# Patient Record
Sex: Female | Born: 1935 | Race: Black or African American | Hispanic: No | Smoking: Never smoker
Health system: Southern US, Community
[De-identification: ages and names within clinical notes are randomized; demographics above are authoritative.]

## PROBLEM LIST (undated history)

## (undated) DIAGNOSIS — R569 Unspecified convulsions: Secondary | ICD-10-CM

## (undated) DIAGNOSIS — I10 Essential (primary) hypertension: Secondary | ICD-10-CM

## (undated) DIAGNOSIS — J449 Chronic obstructive pulmonary disease, unspecified: Secondary | ICD-10-CM

## (undated) DIAGNOSIS — I5030 Unspecified diastolic (congestive) heart failure: Secondary | ICD-10-CM

## (undated) DIAGNOSIS — C50919 Malignant neoplasm of unspecified site of unspecified female breast: Secondary | ICD-10-CM

## (undated) DIAGNOSIS — E785 Hyperlipidemia, unspecified: Secondary | ICD-10-CM

---

## 2003-09-15 ENCOUNTER — Other Ambulatory Visit: Payer: Self-pay

## 2003-10-14 ENCOUNTER — Other Ambulatory Visit: Payer: Self-pay

## 2004-05-20 ENCOUNTER — Other Ambulatory Visit: Payer: Self-pay

## 2004-05-20 ENCOUNTER — Emergency Department: Payer: Self-pay | Admitting: Emergency Medicine

## 2004-05-23 ENCOUNTER — Emergency Department: Payer: Self-pay | Admitting: Emergency Medicine

## 2004-05-23 ENCOUNTER — Other Ambulatory Visit: Payer: Self-pay

## 2004-12-25 ENCOUNTER — Emergency Department: Payer: Self-pay | Admitting: Emergency Medicine

## 2005-01-19 ENCOUNTER — Emergency Department: Payer: Self-pay | Admitting: Unknown Physician Specialty

## 2005-01-21 ENCOUNTER — Ambulatory Visit: Payer: Self-pay | Admitting: Unknown Physician Specialty

## 2006-05-22 ENCOUNTER — Emergency Department: Payer: Self-pay | Admitting: Emergency Medicine

## 2006-05-23 ENCOUNTER — Emergency Department: Payer: Self-pay | Admitting: Emergency Medicine

## 2007-04-21 ENCOUNTER — Other Ambulatory Visit: Payer: Self-pay

## 2007-04-21 ENCOUNTER — Emergency Department: Payer: Self-pay | Admitting: Emergency Medicine

## 2007-05-20 ENCOUNTER — Ambulatory Visit: Payer: Self-pay | Admitting: Internal Medicine

## 2007-07-12 ENCOUNTER — Ambulatory Visit: Payer: Self-pay | Admitting: Internal Medicine

## 2007-08-10 ENCOUNTER — Encounter: Payer: Self-pay | Admitting: Internal Medicine

## 2007-09-05 ENCOUNTER — Encounter: Payer: Self-pay | Admitting: Internal Medicine

## 2007-09-29 ENCOUNTER — Emergency Department: Payer: Self-pay | Admitting: Emergency Medicine

## 2007-09-29 ENCOUNTER — Other Ambulatory Visit: Payer: Self-pay

## 2007-10-03 ENCOUNTER — Encounter: Payer: Self-pay | Admitting: Internal Medicine

## 2007-11-03 ENCOUNTER — Encounter: Payer: Self-pay | Admitting: Internal Medicine

## 2007-12-03 ENCOUNTER — Encounter: Payer: Self-pay | Admitting: Internal Medicine

## 2008-01-03 ENCOUNTER — Encounter: Payer: Self-pay | Admitting: Internal Medicine

## 2009-01-17 ENCOUNTER — Inpatient Hospital Stay: Payer: Self-pay | Admitting: Internal Medicine

## 2009-04-22 ENCOUNTER — Emergency Department: Payer: Self-pay | Admitting: Emergency Medicine

## 2009-04-29 ENCOUNTER — Emergency Department: Payer: Self-pay | Admitting: Emergency Medicine

## 2009-05-01 ENCOUNTER — Emergency Department: Payer: Self-pay | Admitting: Emergency Medicine

## 2009-05-08 ENCOUNTER — Ambulatory Visit: Payer: Self-pay | Admitting: Internal Medicine

## 2009-09-24 ENCOUNTER — Emergency Department: Payer: Self-pay | Admitting: Emergency Medicine

## 2009-09-29 ENCOUNTER — Inpatient Hospital Stay: Payer: Self-pay | Admitting: *Deleted

## 2009-10-02 ENCOUNTER — Ambulatory Visit: Payer: Self-pay | Admitting: Internal Medicine

## 2009-11-02 ENCOUNTER — Ambulatory Visit: Payer: Self-pay | Admitting: Internal Medicine

## 2009-12-17 ENCOUNTER — Ambulatory Visit: Payer: Self-pay | Admitting: Internal Medicine

## 2010-04-12 ENCOUNTER — Other Ambulatory Visit: Payer: Self-pay | Admitting: Internal Medicine

## 2010-04-13 ENCOUNTER — Emergency Department: Payer: Self-pay | Admitting: Emergency Medicine

## 2010-06-18 ENCOUNTER — Ambulatory Visit: Payer: Self-pay | Admitting: Internal Medicine

## 2010-06-21 ENCOUNTER — Ambulatory Visit: Payer: Self-pay | Admitting: Internal Medicine

## 2010-06-30 ENCOUNTER — Emergency Department: Payer: Self-pay | Admitting: Emergency Medicine

## 2010-07-09 ENCOUNTER — Ambulatory Visit: Payer: Self-pay | Admitting: Internal Medicine

## 2010-10-13 ENCOUNTER — Emergency Department: Payer: Self-pay | Admitting: Emergency Medicine

## 2011-01-27 ENCOUNTER — Ambulatory Visit: Payer: Self-pay | Admitting: Ophthalmology

## 2011-02-11 ENCOUNTER — Ambulatory Visit: Payer: Self-pay | Admitting: Ophthalmology

## 2011-04-23 ENCOUNTER — Inpatient Hospital Stay: Payer: Self-pay | Admitting: Specialist

## 2011-04-26 ENCOUNTER — Emergency Department: Payer: Self-pay | Admitting: Emergency Medicine

## 2011-08-20 ENCOUNTER — Ambulatory Visit: Payer: Self-pay | Admitting: Internal Medicine

## 2011-08-21 ENCOUNTER — Ambulatory Visit: Payer: Self-pay | Admitting: Internal Medicine

## 2011-10-10 ENCOUNTER — Ambulatory Visit: Payer: Self-pay | Admitting: General Surgery

## 2011-10-10 LAB — COMPREHENSIVE METABOLIC PANEL
Alkaline Phosphatase: 112 U/L (ref 50–136)
BUN: 13 mg/dL (ref 7–18)
Bilirubin,Total: 0.3 mg/dL (ref 0.2–1.0)
Calcium, Total: 9.2 mg/dL (ref 8.5–10.1)
Creatinine: 0.96 mg/dL (ref 0.60–1.30)
EGFR (Non-African Amer.): >60
Glucose: 90 mg/dL (ref 65–99)
Osmolality: 281 (ref 275–301)
Potassium: 4 mmol/L (ref 3.5–5.1)
Sodium: 141 mmol/L (ref 136–145)
Total Protein: 7.1 g/dL (ref 6.4–8.2)

## 2011-10-10 LAB — CBC WITH DIFFERENTIAL/PLATELET
Basophil #: 0.1 10*3/uL (ref 0.0–0.1)
Eosinophil #: 0.1 10*3/uL (ref 0.0–0.7)
HGB: 14.1 g/dL (ref 12.0–16.0)
Lymphocyte #: 2.8 10*3/uL (ref 1.0–3.6)
MCH: 30.2 pg (ref 26.0–34.0)
MCHC: 33.5 g/dL (ref 32.0–36.0)
MCV: 90 fL (ref 80–100)
Neutrophil #: 1.9 10*3/uL (ref 1.4–6.5)
Platelet: 224 10*3/uL (ref 150–440)
RBC: 4.66 10*6/uL (ref 3.80–5.20)
RDW: 14.5 % (ref 11.5–14.5)

## 2011-10-11 LAB — CANCER ANTIGEN 27.29: CA 27.29: 22.1 U/mL (ref 0.0–38.6)

## 2011-10-13 ENCOUNTER — Other Ambulatory Visit: Payer: Self-pay | Admitting: General Surgery

## 2011-10-13 DIAGNOSIS — C50912 Malignant neoplasm of unspecified site of left female breast: Secondary | ICD-10-CM

## 2011-10-17 ENCOUNTER — Ambulatory Visit
Admission: RE | Admit: 2011-10-17 | Discharge: 2011-10-17 | Disposition: A | Discharge status: Home / Self Care | Payer: PRIVATE HEALTH INSURANCE | Source: Ambulatory Visit | Attending: General Surgery | Admitting: General Surgery

## 2011-10-17 DIAGNOSIS — C50912 Malignant neoplasm of unspecified site of left female breast: Secondary | ICD-10-CM

## 2011-10-17 MED ORDER — GADOBENATE DIMEGLUMINE 529 MG/ML IV SOLN
12.0000 mL | Freq: Once | INTRAVENOUS | Status: AC | PRN
  Administered 2011-10-17: 12 mL via INTRAVENOUS

## 2011-10-28 ENCOUNTER — Ambulatory Visit: Payer: Self-pay | Admitting: General Surgery

## 2011-11-03 LAB — PATHOLOGY REPORT

## 2011-11-04 ENCOUNTER — Ambulatory Visit: Payer: Self-pay | Admitting: General Surgery

## 2011-11-10 ENCOUNTER — Ambulatory Visit: Payer: Self-pay | Admitting: Oncology

## 2011-11-10 LAB — CBC CANCER CENTER
Basophil #: 0.1 x10 3/mm (ref 0.0–0.1)
Eosinophil #: 0.2 x10 3/mm (ref 0.0–0.7)
HCT: 40.7 % (ref 35.0–47.0)
Lymphocyte #: 2.7 x10 3/mm (ref 1.0–3.6)
Lymphocyte %: 46.9 %
MCH: 30 pg (ref 26.0–34.0)
MCHC: 33.9 g/dL (ref 32.0–36.0)
Neutrophil #: 2.3 x10 3/mm (ref 1.4–6.5)
Neutrophil %: 40.1 %
Platelet: 300 x10 3/mm (ref 150–440)
RBC: 4.6 10*6/uL (ref 3.80–5.20)
WBC: 5.7 x10 3/mm (ref 3.6–11.0)

## 2011-11-10 LAB — COMPREHENSIVE METABOLIC PANEL
Alkaline Phosphatase: 106 U/L (ref 50–136)
BUN: 10 mg/dL (ref 7–18)
Bilirubin,Total: 0.2 mg/dL (ref 0.2–1.0)
Calcium, Total: 9.1 mg/dL (ref 8.5–10.1)
Creatinine: 0.85 mg/dL (ref 0.60–1.30)
EGFR (African American): >60
Glucose: 86 mg/dL (ref 65–99)
Osmolality: 278 (ref 275–301)

## 2011-11-13 ENCOUNTER — Ambulatory Visit: Payer: Self-pay | Admitting: General Surgery

## 2011-11-14 ENCOUNTER — Ambulatory Visit: Payer: Self-pay | Admitting: Oncology

## 2011-11-17 LAB — CBC CANCER CENTER
Basophil %: 2.5 %
Eosinophil #: 0.2 x10 3/mm (ref 0.0–0.7)
Eosinophil %: 4 %
HCT: 39.6 % (ref 35.0–47.0)
MCHC: 33.1 g/dL (ref 32.0–36.0)
MCV: 90 fL (ref 80–100)
Monocyte #: 0.6 x10 3/mm (ref 0.2–0.9)
Monocyte %: 11.7 %
Neutrophil #: 2.3 x10 3/mm (ref 1.4–6.5)
Neutrophil %: 44.3 %
Platelet: 235 x10 3/mm (ref 150–440)
RBC: 4.42 10*6/uL (ref 3.80–5.20)
RDW: 15.2 % — ABNORMAL HIGH (ref 11.5–14.5)
WBC: 5.3 x10 3/mm (ref 3.6–11.0)

## 2011-11-17 LAB — COMPREHENSIVE METABOLIC PANEL
Albumin: 3.2 g/dL — ABNORMAL LOW (ref 3.4–5.0)
Alkaline Phosphatase: 116 U/L (ref 50–136)
Anion Gap: 7 (ref 7–16)
BUN: 11 mg/dL (ref 7–18)
Bilirubin,Total: 0.4 mg/dL (ref 0.2–1.0)
Creatinine: 0.91 mg/dL (ref 0.60–1.30)
EGFR (African American): >60
EGFR (Non-African Amer.): >60
Glucose: 93 mg/dL (ref 65–99)
Osmolality: 278 (ref 275–301)
Potassium: 3.6 mmol/L (ref 3.5–5.1)
SGPT (ALT): 14 U/L
Sodium: 140 mmol/L (ref 136–145)
Total Protein: 6.7 g/dL (ref 6.4–8.2)

## 2011-11-24 LAB — CBC CANCER CENTER
Comment - H1-Com1: NORMAL
HCT: 38.9 % (ref 35.0–47.0)
HGB: 12.7 g/dL (ref 12.0–16.0)
Lymphocytes: 25 %
MCHC: 32.7 g/dL (ref 32.0–36.0)
MCV: 89 fL (ref 80–100)
Metamyelocyte: 2 %
Monocytes: 17 %
RDW: 14.3 % (ref 11.5–14.5)
Segmented Neutrophils: 42 %

## 2011-12-01 LAB — CBC CANCER CENTER
Basophil %: 0.9 %
Eosinophil #: 0 x10 3/mm (ref 0.0–0.7)
Eosinophil %: 0.1 %
HGB: 12.9 g/dL (ref 12.0–16.0)
Lymphocyte #: 2.4 x10 3/mm (ref 1.0–3.6)
Lymphocyte %: 19.2 %
Monocyte %: 7.6 %
Neutrophil #: 9 x10 3/mm — ABNORMAL HIGH (ref 1.4–6.5)
Neutrophil %: 72.2 %
Platelet: 144 x10 3/mm — ABNORMAL LOW (ref 150–440)
WBC: 12.4 x10 3/mm — ABNORMAL HIGH (ref 3.6–11.0)

## 2011-12-03 ENCOUNTER — Ambulatory Visit: Payer: Self-pay | Admitting: Oncology

## 2011-12-08 LAB — COMPREHENSIVE METABOLIC PANEL
Alkaline Phosphatase: 107 U/L (ref 50–136)
Anion Gap: 5 — ABNORMAL LOW (ref 7–16)
Calcium, Total: 9.1 mg/dL (ref 8.5–10.1)
Chloride: 107 mmol/L (ref 98–107)
Co2: 29 mmol/L (ref 21–32)
Creatinine: 0.74 mg/dL (ref 0.60–1.30)
EGFR (Non-African Amer.): >60
Glucose: 113 mg/dL — ABNORMAL HIGH (ref 65–99)
Osmolality: 281 (ref 275–301)
Potassium: 3.5 mmol/L (ref 3.5–5.1)
SGOT(AST): 15 U/L (ref 15–37)
Sodium: 141 mmol/L (ref 136–145)
Total Protein: 6.7 g/dL (ref 6.4–8.2)

## 2011-12-08 LAB — CBC CANCER CENTER
Basophil #: 0 x10 3/mm (ref 0.0–0.1)
Basophil %: 0.4 %
Eosinophil #: 0.1 x10 3/mm (ref 0.0–0.7)
Lymphocyte #: 1.7 x10 3/mm (ref 1.0–3.6)
MCHC: 33 g/dL (ref 32.0–36.0)
MCV: 90 fL (ref 80–100)
Monocyte %: 12.8 %
Neutrophil %: 57.8 %
RBC: 4.22 10*6/uL (ref 3.80–5.20)
RDW: 15.2 % — ABNORMAL HIGH (ref 11.5–14.5)

## 2011-12-15 LAB — CBC CANCER CENTER
Basophil #: 0.1 x10 3/mm (ref 0.0–0.1)
Basophil %: 0.4 %
Eosinophil #: 0.1 x10 3/mm (ref 0.0–0.7)
HCT: 37.3 % (ref 35.0–47.0)
HGB: 12.3 g/dL (ref 12.0–16.0)
Lymphocyte #: 2.3 x10 3/mm (ref 1.0–3.6)
Lymphocyte %: 11.3 %
MCH: 29.4 pg (ref 26.0–34.0)
MCHC: 32.9 g/dL (ref 32.0–36.0)
Monocyte %: 13.9 %
Neutrophil %: 73.8 %
WBC: 20.6 x10 3/mm — ABNORMAL HIGH (ref 3.6–11.0)

## 2011-12-22 LAB — CBC CANCER CENTER
Eosinophil %: 0.3 %
HCT: 37.4 % (ref 35.0–47.0)
HGB: 12.2 g/dL (ref 12.0–16.0)
Lymphocyte #: 2.1 x10 3/mm (ref 1.0–3.6)
MCH: 29.4 pg (ref 26.0–34.0)
MCHC: 32.8 g/dL (ref 32.0–36.0)
MCV: 90 fL (ref 80–100)
Monocyte #: 1.1 x10 3/mm — ABNORMAL HIGH (ref 0.2–0.9)
Monocyte %: 7.7 %
Neutrophil #: 10.7 x10 3/mm — ABNORMAL HIGH (ref 1.4–6.5)
Neutrophil %: 76.3 %
Platelet: 203 x10 3/mm (ref 150–440)
RDW: 15.8 % — ABNORMAL HIGH (ref 11.5–14.5)
WBC: 14 x10 3/mm — ABNORMAL HIGH (ref 3.6–11.0)

## 2011-12-30 LAB — CBC CANCER CENTER
Basophil %: 1.2 %
Eosinophil #: 0.1 x10 3/mm (ref 0.0–0.7)
HCT: 34.3 % — ABNORMAL LOW (ref 35.0–47.0)
HGB: 11.4 g/dL — ABNORMAL LOW (ref 12.0–16.0)
Lymphocyte #: 1.5 x10 3/mm (ref 1.0–3.6)
MCH: 29.8 pg (ref 26.0–34.0)
MCHC: 33.4 g/dL (ref 32.0–36.0)
MCV: 89 fL (ref 80–100)
Monocyte #: 1.1 x10 3/mm — ABNORMAL HIGH (ref 0.2–0.9)
Monocyte %: 18.3 %
Neutrophil #: 3.4 x10 3/mm (ref 1.4–6.5)
Platelet: 222 x10 3/mm (ref 150–440)
RDW: 16.6 % — ABNORMAL HIGH (ref 11.5–14.5)
WBC: 6.1 x10 3/mm (ref 3.6–11.0)

## 2011-12-30 LAB — COMPREHENSIVE METABOLIC PANEL
Alkaline Phosphatase: 100 U/L (ref 50–136)
Anion Gap: 7 (ref 7–16)
BUN: 8 mg/dL (ref 7–18)
Bilirubin,Total: 0.6 mg/dL (ref 0.2–1.0)
Calcium, Total: 8.9 mg/dL (ref 8.5–10.1)
Creatinine: 0.64 mg/dL (ref 0.60–1.30)
EGFR (African American): >60
EGFR (Non-African Amer.): >60
Glucose: 93 mg/dL (ref 65–99)
Osmolality: 281 (ref 275–301)
SGOT(AST): 16 U/L (ref 15–37)
SGPT (ALT): 15 U/L
Total Protein: 6 g/dL — ABNORMAL LOW (ref 6.4–8.2)

## 2011-12-31 ENCOUNTER — Encounter: Payer: Self-pay | Admitting: Oncology

## 2012-01-03 ENCOUNTER — Ambulatory Visit: Payer: Self-pay | Admitting: Oncology

## 2012-01-03 ENCOUNTER — Encounter: Payer: Self-pay | Admitting: Oncology

## 2012-01-06 LAB — CBC CANCER CENTER
Basophil %: 1.8 %
Eosinophil #: 0 x10 3/mm (ref 0.0–0.7)
Eosinophil %: 2.5 %
HCT: 33.5 % — ABNORMAL LOW (ref 35.0–47.0)
MCH: 29.9 pg (ref 26.0–34.0)
Monocyte #: 0.1 x10 3/mm — ABNORMAL LOW (ref 0.2–0.9)
Monocyte %: 4.5 %
Neutrophil %: 34.8 %
Platelet: 188 x10 3/mm (ref 150–440)
WBC: 1.7 x10 3/mm — CL (ref 3.6–11.0)

## 2012-01-13 LAB — CBC CANCER CENTER
Eosinophil #: 0 x10 3/mm (ref 0.0–0.7)
Eosinophil %: 0.7 %
HGB: 11.2 g/dL — ABNORMAL LOW (ref 12.0–16.0)
Lymphocyte #: 1.5 x10 3/mm (ref 1.0–3.6)
Lymphocyte %: 60.1 %
MCH: 29.3 pg (ref 26.0–34.0)
MCHC: 32.8 g/dL (ref 32.0–36.0)
MCV: 89 fL (ref 80–100)
Monocyte #: 0.7 x10 3/mm (ref 0.2–0.9)
Monocyte %: 29.4 %
Neutrophil #: 0.2 x10 3/mm — ABNORMAL LOW (ref 1.4–6.5)

## 2012-01-20 LAB — CBC CANCER CENTER
Basophil #: 0.1 x10 3/mm (ref 0.0–0.1)
Eosinophil #: 0.1 x10 3/mm (ref 0.0–0.7)
Eosinophil %: 1.2 %
HCT: 34.5 % — ABNORMAL LOW (ref 35.0–47.0)
HGB: 11.4 g/dL — ABNORMAL LOW (ref 12.0–16.0)
Lymphocyte #: 1.3 x10 3/mm (ref 1.0–3.6)
MCHC: 33.1 g/dL (ref 32.0–36.0)
MCV: 89 fL (ref 80–100)
Monocyte %: 20.8 %
Neutrophil #: 2.3 x10 3/mm (ref 1.4–6.5)
Neutrophil %: 48.9 %
Platelet: 234 x10 3/mm (ref 150–440)
WBC: 4.8 x10 3/mm (ref 3.6–11.0)

## 2012-01-20 LAB — COMPREHENSIVE METABOLIC PANEL
Albumin: 2.8 g/dL — ABNORMAL LOW (ref 3.4–5.0)
Anion Gap: 10 (ref 7–16)
BUN: 8 mg/dL (ref 7–18)
Creatinine: 0.8 mg/dL (ref 0.60–1.30)
EGFR (African American): >60
EGFR (Non-African Amer.): >60
Osmolality: 279 (ref 275–301)
Potassium: 2.9 mmol/L — ABNORMAL LOW (ref 3.5–5.1)
SGOT(AST): 14 U/L — ABNORMAL LOW (ref 15–37)
SGPT (ALT): 13 U/L
Sodium: 140 mmol/L (ref 136–145)
Total Protein: 6.2 g/dL — ABNORMAL LOW (ref 6.4–8.2)

## 2012-01-25 ENCOUNTER — Emergency Department: Payer: Self-pay | Admitting: *Deleted

## 2012-01-25 LAB — CBC WITH DIFFERENTIAL/PLATELET
Basophil #: 0 10*3/uL (ref 0.0–0.1)
Basophil %: 1 %
Eosinophil #: 0.1 10*3/uL (ref 0.0–0.7)
Eosinophil %: 1.9 %
HCT: 35.1 % (ref 35.0–47.0)
Lymphocyte %: 22.7 %
MCH: 29.7 pg (ref 26.0–34.0)
MCHC: 33 g/dL (ref 32.0–36.0)
Monocyte #: 0.1 x10 3/mm — ABNORMAL LOW (ref 0.2–0.9)
Monocyte %: 3.5 %
Neutrophil #: 1.9 10*3/uL (ref 1.4–6.5)
RBC: 3.89 10*6/uL (ref 3.80–5.20)
RDW: 15.9 % — ABNORMAL HIGH (ref 11.5–14.5)
WBC: 2.7 10*3/uL — ABNORMAL LOW (ref 3.6–11.0)

## 2012-01-25 LAB — THEOPHYLLINE LEVEL: Theophylline: <2 ug/mL — ABNORMAL LOW (ref 10.0–20.0)

## 2012-01-25 LAB — COMPREHENSIVE METABOLIC PANEL
BUN: 12 mg/dL (ref 7–18)
Bilirubin,Total: 0.3 mg/dL (ref 0.2–1.0)
Calcium, Total: 8.9 mg/dL (ref 8.5–10.1)
Chloride: 105 mmol/L (ref 98–107)
EGFR (African American): >60
Osmolality: 275 (ref 275–301)
Potassium: 4.5 mmol/L (ref 3.5–5.1)
SGPT (ALT): 10 U/L — ABNORMAL LOW
Sodium: 138 mmol/L (ref 136–145)

## 2012-01-27 LAB — CBC CANCER CENTER
Basophil #: 0 x10 3/mm (ref 0.0–0.1)
Eosinophil #: 0 x10 3/mm (ref 0.0–0.7)
Eosinophil %: 1.4 %
HCT: 34.4 % — ABNORMAL LOW (ref 35.0–47.0)
HGB: 11.2 g/dL — ABNORMAL LOW (ref 12.0–16.0)
Lymphocyte #: 0.7 x10 3/mm — ABNORMAL LOW (ref 1.0–3.6)
Lymphocyte %: 42.7 %
MCH: 29.3 pg (ref 26.0–34.0)
MCHC: 32.6 g/dL (ref 32.0–36.0)
MCV: 90 fL (ref 80–100)
Monocyte %: 6.6 %
Neutrophil #: 0.8 x10 3/mm — ABNORMAL LOW (ref 1.4–6.5)
RDW: 16.1 % — ABNORMAL HIGH (ref 11.5–14.5)
WBC: 1.7 x10 3/mm — CL (ref 3.6–11.0)

## 2012-02-01 ENCOUNTER — Inpatient Hospital Stay: Payer: Self-pay | Admitting: Internal Medicine

## 2012-02-01 LAB — COMPREHENSIVE METABOLIC PANEL
Albumin: 3.5 g/dL (ref 3.4–5.0)
Alkaline Phosphatase: 198 U/L — ABNORMAL HIGH (ref 50–136)
Anion Gap: 10 (ref 7–16)
Bilirubin,Total: 0.4 mg/dL (ref 0.2–1.0)
Calcium, Total: 9.4 mg/dL (ref 8.5–10.1)
Co2: 27 mmol/L (ref 21–32)
Creatinine: 1.17 mg/dL (ref 0.60–1.30)
EGFR (African American): 53 — ABNORMAL LOW
EGFR (Non-African Amer.): 46 — ABNORMAL LOW
Glucose: 86 mg/dL (ref 65–99)
Osmolality: 281 (ref 275–301)
SGOT(AST): 32 U/L (ref 15–37)
SGPT (ALT): 16 U/L
Sodium: 140 mmol/L (ref 136–145)
Total Protein: 7.1 g/dL (ref 6.4–8.2)

## 2012-02-01 LAB — CK TOTAL AND CKMB (NOT AT ARMC)
CK, Total: 40 U/L (ref 21–215)
CK, Total: 27 U/L (ref 21–215)
CK, Total: 34 U/L (ref 21–215)
CK-MB: 0.8 ng/mL (ref 0.5–3.6)
CK-MB: <0.5 ng/mL — ABNORMAL LOW (ref 0.5–3.6)
CK-MB: <0.5 ng/mL — ABNORMAL LOW (ref 0.5–3.6)

## 2012-02-01 LAB — CBC WITH DIFFERENTIAL/PLATELET
HCT: 37.1 % (ref 35.0–47.0)
HGB: 12 g/dL (ref 12.0–16.0)
Lymphocytes: 10 %
MCHC: 32.4 g/dL (ref 32.0–36.0)
MCV: 90 fL (ref 80–100)
Metamyelocyte: 12 %
Monocytes: 6 %
NRBC/100 WBC: 4 /
Platelet: 205 10*3/uL (ref 150–440)
RBC: 4.14 10*6/uL (ref 3.80–5.20)
RDW: 16.6 % — ABNORMAL HIGH (ref 11.5–14.5)

## 2012-02-01 LAB — PROTIME-INR: Prothrombin Time: 13 secs (ref 11.5–14.7)

## 2012-02-01 LAB — THEOPHYLLINE LEVEL: Theophylline: <2 ug/mL — ABNORMAL LOW (ref 10.0–20.0)

## 2012-02-01 LAB — TROPONIN I: Troponin-I: <0.02 ng/mL

## 2012-02-02 ENCOUNTER — Encounter: Payer: Self-pay | Admitting: Oncology

## 2012-02-02 ENCOUNTER — Ambulatory Visit: Payer: Self-pay | Admitting: Oncology

## 2012-02-03 LAB — CBC CANCER CENTER
Bands: 16 %
HCT: 36.1 % (ref 35.0–47.0)
HGB: 11.2 g/dL — ABNORMAL LOW (ref 12.0–16.0)
Lymphocytes: 2 %
MCH: 27.8 pg (ref 26.0–34.0)
Metamyelocyte: 2 %
Monocytes: 3 %
Myelocyte: 1 %
RBC: 4.04 10*6/uL (ref 3.80–5.20)
Segmented Neutrophils: 76 %
WBC: 85.1 x10 3/mm — ABNORMAL HIGH (ref 3.6–11.0)

## 2012-02-10 LAB — CBC CANCER CENTER
Eosinophil #: 0 x10 3/mm (ref 0.0–0.7)
HCT: 34.7 % — ABNORMAL LOW (ref 35.0–47.0)
Lymphocyte #: 1 x10 3/mm (ref 1.0–3.6)
MCH: 29.8 pg (ref 26.0–34.0)
MCHC: 33 g/dL (ref 32.0–36.0)
Monocyte #: 0.9 x10 3/mm (ref 0.2–0.9)
Neutrophil %: 77.7 %
Platelet: 206 x10 3/mm (ref 150–440)
WBC: 9 x10 3/mm (ref 3.6–11.0)

## 2012-02-10 LAB — COMPREHENSIVE METABOLIC PANEL
Anion Gap: 8 (ref 7–16)
BUN: 10 mg/dL (ref 7–18)
Calcium, Total: 8.4 mg/dL — ABNORMAL LOW (ref 8.5–10.1)
Chloride: 104 mmol/L (ref 98–107)
Co2: 27 mmol/L (ref 21–32)
EGFR (African American): >60
EGFR (Non-African Amer.): >60
Potassium: 3.8 mmol/L (ref 3.5–5.1)
SGOT(AST): 12 U/L — ABNORMAL LOW (ref 15–37)
SGPT (ALT): 19 U/L
Total Protein: 6 g/dL — ABNORMAL LOW (ref 6.4–8.2)

## 2012-02-17 ENCOUNTER — Ambulatory Visit: Payer: Self-pay

## 2012-02-17 LAB — CBC CANCER CENTER
Basophil #: 0 x10 3/mm (ref 0.0–0.1)
Basophil %: 3.7 %
Eosinophil #: 0 x10 3/mm (ref 0.0–0.7)
HCT: 32.8 % — ABNORMAL LOW (ref 35.0–47.0)
Lymphocyte #: 0.6 x10 3/mm — ABNORMAL LOW (ref 1.0–3.6)
MCH: 29.5 pg (ref 26.0–34.0)
MCHC: 32.7 g/dL (ref 32.0–36.0)
MCV: 90 fL (ref 80–100)
Monocyte #: 0.1 x10 3/mm — ABNORMAL LOW (ref 0.2–0.9)
Monocyte %: 7.9 %
Neutrophil #: 0.3 x10 3/mm — ABNORMAL LOW (ref 1.4–6.5)
Platelet: 178 x10 3/mm (ref 150–440)
RDW: 17.3 % — ABNORMAL HIGH (ref 11.5–14.5)

## 2012-02-24 LAB — CBC CANCER CENTER
Eosinophil #: 0 x10 3/mm (ref 0.0–0.7)
Eosinophil %: 0.3 %
HGB: 11.2 g/dL — ABNORMAL LOW (ref 12.0–16.0)
Lymphocyte #: 0.9 x10 3/mm — ABNORMAL LOW (ref 1.0–3.6)
Lymphocyte %: 41.4 %
MCHC: 32.3 g/dL (ref 32.0–36.0)
MCV: 90 fL (ref 80–100)
Monocyte #: 0.7 x10 3/mm (ref 0.2–0.9)
Neutrophil #: 0.5 x10 3/mm — ABNORMAL LOW (ref 1.4–6.5)
Neutrophil %: 24.1 %
Platelet: 224 x10 3/mm (ref 150–440)
RBC: 3.85 10*6/uL (ref 3.80–5.20)
RDW: 17.8 % — ABNORMAL HIGH (ref 11.5–14.5)

## 2012-03-02 LAB — COMPREHENSIVE METABOLIC PANEL
Albumin: 2.9 g/dL — ABNORMAL LOW (ref 3.4–5.0)
Anion Gap: 10 (ref 7–16)
BUN: 11 mg/dL (ref 7–18)
Calcium, Total: 8.9 mg/dL (ref 8.5–10.1)
Chloride: 105 mmol/L (ref 98–107)
Creatinine: 0.88 mg/dL (ref 0.60–1.30)
EGFR (African American): >60
Glucose: 109 mg/dL — ABNORMAL HIGH (ref 65–99)
Osmolality: 281 (ref 275–301)
Potassium: 3.8 mmol/L (ref 3.5–5.1)
SGOT(AST): 11 U/L — ABNORMAL LOW (ref 15–37)
Sodium: 141 mmol/L (ref 136–145)
Total Protein: 6.1 g/dL — ABNORMAL LOW (ref 6.4–8.2)

## 2012-03-02 LAB — CBC CANCER CENTER
Basophil %: 0.5 %
Eosinophil %: 0 %
MCH: 30.5 pg (ref 26.0–34.0)
MCHC: 33.8 g/dL (ref 32.0–36.0)
Monocyte #: 0.4 x10 3/mm (ref 0.2–0.9)
Monocyte %: 8.1 %
Neutrophil #: 4 x10 3/mm (ref 1.4–6.5)
Neutrophil %: 73.8 %
WBC: 5.4 x10 3/mm (ref 3.6–11.0)

## 2012-03-04 ENCOUNTER — Ambulatory Visit: Payer: Self-pay | Admitting: Oncology

## 2012-03-04 ENCOUNTER — Encounter: Payer: Self-pay | Admitting: Oncology

## 2012-03-06 ENCOUNTER — Emergency Department: Payer: Self-pay | Admitting: Emergency Medicine

## 2012-03-06 LAB — COMPREHENSIVE METABOLIC PANEL
Alkaline Phosphatase: 126 U/L (ref 50–136)
Anion Gap: 13 (ref 7–16)
Bilirubin,Total: 0.5 mg/dL (ref 0.2–1.0)
Chloride: 107 mmol/L (ref 98–107)
Co2: 23 mmol/L (ref 21–32)
Creatinine: 0.68 mg/dL (ref 0.60–1.30)
EGFR (African American): >60
EGFR (Non-African Amer.): >60
Osmolality: 284 (ref 275–301)
Potassium: 3 mmol/L — ABNORMAL LOW (ref 3.5–5.1)
SGOT(AST): 17 U/L (ref 15–37)
SGPT (ALT): 12 U/L (ref 12–78)

## 2012-03-06 LAB — CBC WITH DIFFERENTIAL/PLATELET
Basophil #: 0 10*3/uL (ref 0.0–0.1)
Basophil %: 0.9 %
Eosinophil #: 0.1 10*3/uL (ref 0.0–0.7)
Eosinophil %: 0.2 %
HGB: 11.4 g/dL — ABNORMAL LOW (ref 12.0–16.0)
Lymphocyte #: 1.2 10*3/uL (ref 1.0–3.6)
MCH: 30 pg (ref 26.0–34.0)
MCV: 90 fL (ref 80–100)
Monocyte #: 0.3 x10 3/mm (ref 0.2–0.9)
Neutrophil #: 37.9 10*3/uL — ABNORMAL HIGH (ref 1.4–6.5)
RBC: 3.81 10*6/uL (ref 3.80–5.20)
RDW: 18 % — ABNORMAL HIGH (ref 11.5–14.5)

## 2012-03-06 LAB — MAGNESIUM: Magnesium: 1.9 mg/dL

## 2012-03-11 LAB — CULTURE, BLOOD (SINGLE)

## 2012-04-04 ENCOUNTER — Ambulatory Visit: Payer: Self-pay | Admitting: Oncology

## 2012-04-04 ENCOUNTER — Encounter: Payer: Self-pay | Admitting: Oncology

## 2012-04-06 ENCOUNTER — Ambulatory Visit: Payer: Self-pay | Admitting: Oncology

## 2012-04-06 LAB — CBC CANCER CENTER
Basophil #: 0.1 x10 3/mm (ref 0.0–0.1)
Basophil %: 1 %
Eosinophil #: 0.2 x10 3/mm (ref 0.0–0.7)
Eosinophil %: 4.7 %
HCT: 37 % (ref 35.0–47.0)
HGB: 12 g/dL (ref 12.0–16.0)
Lymphocyte %: 41 %
MCHC: 32.5 g/dL (ref 32.0–36.0)
Monocyte %: 11.7 %
Neutrophil #: 2.1 x10 3/mm (ref 1.4–6.5)
Platelet: 247 x10 3/mm (ref 150–440)
RBC: 3.94 10*6/uL (ref 3.80–5.20)
WBC: 5.1 x10 3/mm (ref 3.6–11.0)

## 2012-04-06 LAB — COMPREHENSIVE METABOLIC PANEL
Alkaline Phosphatase: 110 U/L (ref 50–136)
BUN: 9 mg/dL (ref 7–18)
Bilirubin,Total: 0.4 mg/dL (ref 0.2–1.0)
Calcium, Total: 8.7 mg/dL (ref 8.5–10.1)
Chloride: 110 mmol/L — ABNORMAL HIGH (ref 98–107)
Creatinine: 0.83 mg/dL (ref 0.60–1.30)
EGFR (African American): >60
EGFR (Non-African Amer.): >60
Glucose: 91 mg/dL (ref 65–99)
SGOT(AST): 20 U/L (ref 15–37)
SGPT (ALT): 14 U/L (ref 12–78)
Sodium: 141 mmol/L (ref 136–145)

## 2012-04-29 LAB — CBC CANCER CENTER
Basophil %: 1.4 %
Eosinophil %: 4.6 %
HCT: 39.5 % (ref 35.0–47.0)
MCH: 30.5 pg (ref 26.0–34.0)
MCV: 94 fL (ref 80–100)
Monocyte #: 0.4 x10 3/mm (ref 0.2–0.9)
Monocyte %: 14 %
Neutrophil #: 1.6 x10 3/mm (ref 1.4–6.5)
Platelet: 180 x10 3/mm (ref 150–440)
RBC: 4.19 10*6/uL (ref 3.80–5.20)
RDW: 16.7 % — ABNORMAL HIGH (ref 11.5–14.5)
WBC: 3.1 x10 3/mm — ABNORMAL LOW (ref 3.6–11.0)

## 2012-05-04 ENCOUNTER — Ambulatory Visit: Payer: Self-pay | Admitting: Oncology

## 2012-05-05 LAB — CBC CANCER CENTER
Basophil #: 0.1 x10 3/mm (ref 0.0–0.1)
Basophil %: 1.4 %
HCT: 39.5 % (ref 35.0–47.0)
HGB: 12.9 g/dL (ref 12.0–16.0)
Lymphocyte #: 1.1 x10 3/mm (ref 1.0–3.6)
Lymphocyte %: 29.8 %
MCH: 30.5 pg (ref 26.0–34.0)
MCV: 94 fL (ref 80–100)
Monocyte #: 0.5 x10 3/mm (ref 0.2–0.9)
Monocyte %: 13.1 %
Neutrophil #: 1.9 x10 3/mm (ref 1.4–6.5)
Neutrophil %: 51.4 %
RBC: 4.22 10*6/uL (ref 3.80–5.20)
RDW: 16.6 % — ABNORMAL HIGH (ref 11.5–14.5)
WBC: 3.7 x10 3/mm (ref 3.6–11.0)

## 2012-05-12 LAB — CBC CANCER CENTER
Basophil #: 0 x10 3/mm (ref 0.0–0.1)
Basophil %: 1.7 %
Eosinophil %: 4.6 %
HCT: 40.4 % (ref 35.0–47.0)
Lymphocyte #: 0.8 x10 3/mm — ABNORMAL LOW (ref 1.0–3.6)
MCH: 30.7 pg (ref 26.0–34.0)
MCHC: 32.9 g/dL (ref 32.0–36.0)
MCV: 93 fL (ref 80–100)
Monocyte #: 0.4 x10 3/mm (ref 0.2–0.9)
Monocyte %: 14.2 %
Neutrophil #: 1.5 x10 3/mm (ref 1.4–6.5)
Platelet: 193 x10 3/mm (ref 150–440)
RBC: 4.33 10*6/uL (ref 3.80–5.20)
WBC: 2.8 x10 3/mm — ABNORMAL LOW (ref 3.6–11.0)

## 2012-05-19 LAB — CBC CANCER CENTER
Basophil #: 0 x10 3/mm (ref 0.0–0.1)
Eosinophil #: 0.2 x10 3/mm (ref 0.0–0.7)
HCT: 40.3 % (ref 35.0–47.0)
HGB: 13.2 g/dL (ref 12.0–16.0)
Lymphocyte #: 0.8 x10 3/mm — ABNORMAL LOW (ref 1.0–3.6)
Lymphocyte %: 24.2 %
MCH: 30.7 pg (ref 26.0–34.0)
MCHC: 32.6 g/dL (ref 32.0–36.0)
MCV: 94 fL (ref 80–100)
Monocyte %: 13.5 %
Neutrophil #: 1.8 x10 3/mm (ref 1.4–6.5)
Platelet: 176 x10 3/mm (ref 150–440)
RDW: 15.9 % — ABNORMAL HIGH (ref 11.5–14.5)
WBC: 3.1 x10 3/mm — ABNORMAL LOW (ref 3.6–11.0)

## 2012-05-26 LAB — CBC CANCER CENTER
Basophil #: 0 x10 3/mm (ref 0.0–0.1)
Eosinophil #: 0.1 x10 3/mm (ref 0.0–0.7)
HCT: 41.8 % (ref 35.0–47.0)
MCH: 31 pg (ref 26.0–34.0)
MCHC: 32.8 g/dL (ref 32.0–36.0)
Monocyte #: 0.4 x10 3/mm (ref 0.2–0.9)
Neutrophil %: 53.5 %
Platelet: 192 x10 3/mm (ref 150–440)
RBC: 4.42 10*6/uL (ref 3.80–5.20)
RDW: 14.9 % — ABNORMAL HIGH (ref 11.5–14.5)
WBC: 2.5 x10 3/mm — ABNORMAL LOW (ref 3.6–11.0)

## 2012-06-02 LAB — CBC CANCER CENTER
Basophil #: 0 x10 3/mm (ref 0.0–0.1)
Basophil %: 0.2 %
Eosinophil #: 0.2 x10 3/mm (ref 0.0–0.7)
Eosinophil %: 6.6 %
HGB: 13.7 g/dL (ref 12.0–16.0)
Lymphocyte %: 25.5 %
MCH: 30.7 pg (ref 26.0–34.0)
MCHC: 32.4 g/dL (ref 32.0–36.0)
MCV: 95 fL (ref 80–100)
Monocyte #: 0.5 x10 3/mm (ref 0.2–0.9)
Neutrophil #: 1.2 x10 3/mm — ABNORMAL LOW (ref 1.4–6.5)
Platelet: 192 x10 3/mm (ref 150–440)
RDW: 15.1 % — ABNORMAL HIGH (ref 11.5–14.5)
WBC: 2.5 x10 3/mm — ABNORMAL LOW (ref 3.6–11.0)

## 2012-06-04 ENCOUNTER — Ambulatory Visit: Payer: Self-pay | Admitting: Oncology

## 2012-07-04 ENCOUNTER — Ambulatory Visit: Payer: Self-pay | Admitting: Oncology

## 2012-09-04 ENCOUNTER — Ambulatory Visit: Payer: Self-pay | Admitting: Internal Medicine

## 2012-09-21 ENCOUNTER — Inpatient Hospital Stay: Payer: Self-pay | Admitting: Internal Medicine

## 2012-09-21 LAB — COMPREHENSIVE METABOLIC PANEL
Albumin: 3 g/dL — ABNORMAL LOW (ref 3.4–5.0)
Bilirubin,Total: 0.7 mg/dL (ref 0.2–1.0)
Calcium, Total: 8.9 mg/dL (ref 8.5–10.1)
Co2: 25 mmol/L (ref 21–32)
EGFR (Non-African Amer.): >60
Glucose: 112 mg/dL — ABNORMAL HIGH (ref 65–99)
Osmolality: 279 (ref 275–301)
SGOT(AST): 26 U/L (ref 15–37)
SGPT (ALT): 14 U/L (ref 12–78)
Sodium: 140 mmol/L (ref 136–145)

## 2012-09-21 LAB — CBC
MCHC: 34.5 g/dL (ref 32.0–36.0)
MCV: 91 fL (ref 80–100)
RDW: 14.4 % (ref 11.5–14.5)
WBC: 9.6 10*3/uL (ref 3.6–11.0)

## 2012-09-21 LAB — CK TOTAL AND CKMB (NOT AT ARMC): CK-MB: <0.5 ng/mL — ABNORMAL LOW (ref 0.5–3.6)

## 2012-09-22 LAB — CBC WITH DIFFERENTIAL/PLATELET
Basophil #: 0 10*3/uL (ref 0.0–0.1)
Basophil %: 0.2 %
Eosinophil #: 0 10*3/uL (ref 0.0–0.7)
HGB: 12.4 g/dL (ref 12.0–16.0)
Lymphocyte #: 0.4 10*3/uL — ABNORMAL LOW (ref 1.0–3.6)
Lymphocyte %: 3.3 %
MCH: 31.6 pg (ref 26.0–34.0)
MCV: 94 fL (ref 80–100)
Neutrophil %: 93.7 %
RBC: 3.94 10*6/uL (ref 3.80–5.20)

## 2012-09-22 LAB — BASIC METABOLIC PANEL
Anion Gap: 8 (ref 7–16)
BUN: 12 mg/dL (ref 7–18)
Calcium, Total: 9.3 mg/dL (ref 8.5–10.1)
Chloride: 109 mmol/L — ABNORMAL HIGH (ref 98–107)
Co2: 25 mmol/L (ref 21–32)
EGFR (African American): >60
Glucose: 138 mg/dL — ABNORMAL HIGH (ref 65–99)
Sodium: 142 mmol/L (ref 136–145)

## 2012-09-27 LAB — CULTURE, BLOOD (SINGLE)

## 2012-09-29 LAB — BASIC METABOLIC PANEL
Anion Gap: 5 — ABNORMAL LOW (ref 7–16)
Calcium, Total: 8.7 mg/dL (ref 8.5–10.1)
Co2: 30 mmol/L (ref 21–32)
Creatinine: 0.83 mg/dL (ref 0.60–1.30)
EGFR (African American): >60
Glucose: 92 mg/dL (ref 65–99)
Potassium: 5.1 mmol/L (ref 3.5–5.1)

## 2012-09-29 LAB — TROPONIN I: Troponin-I: <0.02 ng/mL

## 2012-09-29 LAB — CBC
HCT: 43.8 % (ref 35.0–47.0)
MCH: 29.4 pg (ref 26.0–34.0)
MCHC: 32 g/dL (ref 32.0–36.0)
MCV: 92 fL (ref 80–100)
Platelet: 289 10*3/uL (ref 150–440)
WBC: 8.7 10*3/uL (ref 3.6–11.0)

## 2012-09-30 ENCOUNTER — Inpatient Hospital Stay: Payer: Self-pay | Admitting: Internal Medicine

## 2012-10-01 LAB — BASIC METABOLIC PANEL
Anion Gap: 7 (ref 7–16)
Calcium, Total: 8.6 mg/dL (ref 8.5–10.1)
Co2: 29 mmol/L (ref 21–32)
EGFR (African American): 54 — ABNORMAL LOW
EGFR (Non-African Amer.): 47 — ABNORMAL LOW
Glucose: 131 mg/dL — ABNORMAL HIGH (ref 65–99)
Osmolality: 287 (ref 275–301)
Potassium: 4.6 mmol/L (ref 3.5–5.1)

## 2012-10-01 LAB — CBC WITH DIFFERENTIAL/PLATELET
Basophil #: 0 10*3/uL (ref 0.0–0.1)
Basophil %: 0.2 %
Eosinophil %: 0 %
HCT: 36.5 % (ref 35.0–47.0)
HGB: 12.2 g/dL (ref 12.0–16.0)
MCV: 92 fL (ref 80–100)
Monocyte #: 0.4 x10 3/mm (ref 0.2–0.9)
Monocyte %: 2.5 %
Neutrophil #: 15.6 10*3/uL — ABNORMAL HIGH (ref 1.4–6.5)
Neutrophil %: 94.7 %
Platelet: 224 10*3/uL (ref 150–440)
RBC: 3.96 10*6/uL (ref 3.80–5.20)
RDW: 15.1 % — ABNORMAL HIGH (ref 11.5–14.5)
WBC: 16.5 10*3/uL — ABNORMAL HIGH (ref 3.6–11.0)

## 2012-10-02 ENCOUNTER — Ambulatory Visit: Payer: Self-pay | Admitting: Internal Medicine

## 2012-10-03 LAB — CBC WITH DIFFERENTIAL/PLATELET
Basophil #: 0 10*3/uL (ref 0.0–0.1)
Eosinophil #: 0 10*3/uL (ref 0.0–0.7)
HCT: 39.3 % (ref 35.0–47.0)
HGB: 12.8 g/dL (ref 12.0–16.0)
Lymphocyte #: 0.3 10*3/uL — ABNORMAL LOW (ref 1.0–3.6)
MCH: 30.6 pg (ref 26.0–34.0)
Monocyte %: 1.6 %
Neutrophil #: 15.1 10*3/uL — ABNORMAL HIGH (ref 1.4–6.5)
Neutrophil %: 96.6 %
Platelet: 195 10*3/uL (ref 150–440)
RBC: 4.19 10*6/uL (ref 3.80–5.20)
RDW: 15.4 % — ABNORMAL HIGH (ref 11.5–14.5)
WBC: 15.6 10*3/uL — ABNORMAL HIGH (ref 3.6–11.0)

## 2012-10-03 LAB — BASIC METABOLIC PANEL
Anion Gap: 2 — ABNORMAL LOW (ref 7–16)
Calcium, Total: 8.8 mg/dL (ref 8.5–10.1)
Chloride: 106 mmol/L (ref 98–107)
Co2: 34 mmol/L — ABNORMAL HIGH (ref 21–32)
Creatinine: 1.02 mg/dL (ref 0.60–1.30)

## 2012-10-04 LAB — THEOPHYLLINE LEVEL: Theophylline: 4.9 ug/mL — ABNORMAL LOW (ref 10.0–20.0)

## 2012-10-05 LAB — CBC WITH DIFFERENTIAL/PLATELET
Eosinophil %: 0 %
HCT: 35.7 % (ref 35.0–47.0)
Lymphocyte %: 1 %
MCH: 30.8 pg (ref 26.0–34.0)
MCHC: 33.2 g/dL (ref 32.0–36.0)
MCV: 93 fL (ref 80–100)
Monocyte #: 0.3 x10 3/mm (ref 0.2–0.9)
Monocyte %: 3.2 %
Neutrophil #: 9.8 10*3/uL — ABNORMAL HIGH (ref 1.4–6.5)
Platelet: 200 10*3/uL (ref 150–440)
RBC: 3.86 10*6/uL (ref 3.80–5.20)
WBC: 10.2 10*3/uL (ref 3.6–11.0)

## 2012-10-05 LAB — MAGNESIUM: Magnesium: 2.4 mg/dL

## 2012-10-05 LAB — BASIC METABOLIC PANEL
Anion Gap: 7 (ref 7–16)
Calcium, Total: 8.1 mg/dL — ABNORMAL LOW (ref 8.5–10.1)
EGFR (African American): 52 — ABNORMAL LOW
EGFR (Non-African Amer.): 45 — ABNORMAL LOW
Osmolality: 291 (ref 275–301)

## 2012-10-06 LAB — CBC WITH DIFFERENTIAL/PLATELET
Basophil %: 0.1 %
Eosinophil #: 0 10*3/uL (ref 0.0–0.7)
Eosinophil %: 0 %
HCT: 34 % — ABNORMAL LOW (ref 35.0–47.0)
HGB: 11.4 g/dL — ABNORMAL LOW (ref 12.0–16.0)
Lymphocyte #: 0.2 10*3/uL — ABNORMAL LOW (ref 1.0–3.6)
Lymphocyte %: 1.9 %
MCH: 30.9 pg (ref 26.0–34.0)
MCV: 92 fL (ref 80–100)
Monocyte #: 0.5 x10 3/mm (ref 0.2–0.9)
Monocyte %: 5.5 %
Neutrophil #: 7.8 10*3/uL — ABNORMAL HIGH (ref 1.4–6.5)
RDW: 15 % — ABNORMAL HIGH (ref 11.5–14.5)
WBC: 8.4 10*3/uL (ref 3.6–11.0)

## 2012-10-06 LAB — BASIC METABOLIC PANEL
BUN: 46 mg/dL — ABNORMAL HIGH (ref 7–18)
Chloride: 104 mmol/L (ref 98–107)
EGFR (African American): >60
EGFR (Non-African Amer.): 56 — ABNORMAL LOW
Glucose: 115 mg/dL — ABNORMAL HIGH (ref 65–99)
Osmolality: 287 (ref 275–301)
Potassium: 4 mmol/L (ref 3.5–5.1)
Sodium: 137 mmol/L (ref 136–145)

## 2012-10-06 LAB — TRIGLYCERIDES: Triglycerides: 156 mg/dL (ref 0–200)

## 2012-10-07 LAB — CBC WITH DIFFERENTIAL/PLATELET
Basophil %: 0.1 %
Eosinophil #: 0 10*3/uL (ref 0.0–0.7)
HCT: 32.9 % — ABNORMAL LOW (ref 35.0–47.0)
HGB: 11 g/dL — ABNORMAL LOW (ref 12.0–16.0)
Lymphocyte %: 1.9 %
MCH: 30.6 pg (ref 26.0–34.0)
MCHC: 33.3 g/dL (ref 32.0–36.0)
MCV: 92 fL (ref 80–100)
Monocyte %: 3.6 %
Neutrophil #: 6.8 10*3/uL — ABNORMAL HIGH (ref 1.4–6.5)
Neutrophil %: 94.4 %
Platelet: 151 10*3/uL (ref 150–440)
RDW: 14.8 % — ABNORMAL HIGH (ref 11.5–14.5)
WBC: 7.2 10*3/uL (ref 3.6–11.0)

## 2012-10-07 LAB — THEOPHYLLINE LEVEL: Theophylline: 8.6 ug/mL — ABNORMAL LOW (ref 10.0–20.0)

## 2012-10-07 LAB — BASIC METABOLIC PANEL
Anion Gap: 3 — ABNORMAL LOW (ref 7–16)
BUN: 47 mg/dL — ABNORMAL HIGH (ref 7–18)
Calcium, Total: 8 mg/dL — ABNORMAL LOW (ref 8.5–10.1)
Co2: 31 mmol/L (ref 21–32)
Glucose: 129 mg/dL — ABNORMAL HIGH (ref 65–99)
Osmolality: 290 (ref 275–301)

## 2012-10-10 LAB — BASIC METABOLIC PANEL
Calcium, Total: 8.3 mg/dL — ABNORMAL LOW (ref 8.5–10.1)
Chloride: 105 mmol/L (ref 98–107)
Co2: 30 mmol/L (ref 21–32)
Glucose: 85 mg/dL (ref 65–99)
Osmolality: 287 (ref 275–301)

## 2012-10-14 ENCOUNTER — Inpatient Hospital Stay: Payer: Self-pay | Admitting: Internal Medicine

## 2012-10-14 LAB — CBC WITH DIFFERENTIAL/PLATELET
Bands: 39 %
HCT: 40.7 % (ref 35.0–47.0)
HGB: 13.9 g/dL (ref 12.0–16.0)
Lymphocytes: 6 %
MCH: 31.7 pg (ref 26.0–34.0)
MCHC: 34.1 g/dL (ref 32.0–36.0)
MCV: 93 fL (ref 80–100)
Metamyelocyte: 13 %
Monocytes: 1 %
Myelocyte: 1 %
Platelet: 109 10*3/uL — ABNORMAL LOW (ref 150–440)
RBC: 4.37 10*6/uL (ref 3.80–5.20)
RDW: 15 % — ABNORMAL HIGH (ref 11.5–14.5)
Segmented Neutrophils: 40 %
WBC: 14.7 10*3/uL — ABNORMAL HIGH (ref 3.6–11.0)

## 2012-10-14 LAB — TROPONIN I
Troponin-I: 0.03 ng/mL
Troponin-I: <0.02 ng/mL

## 2012-10-14 LAB — COMPREHENSIVE METABOLIC PANEL
Albumin: 2.2 g/dL — ABNORMAL LOW (ref 3.4–5.0)
Alkaline Phosphatase: 88 U/L (ref 50–136)
Anion Gap: 8 (ref 7–16)
BUN: 28 mg/dL — ABNORMAL HIGH (ref 7–18)
Bilirubin,Total: 0.7 mg/dL (ref 0.2–1.0)
Calcium, Total: 8.6 mg/dL (ref 8.5–10.1)
Chloride: 104 mmol/L (ref 98–107)
Creatinine: 1.5 mg/dL — ABNORMAL HIGH (ref 0.60–1.30)
Glucose: 71 mg/dL (ref 65–99)
Osmolality: 283 (ref 275–301)
SGOT(AST): 21 U/L (ref 15–37)
SGPT (ALT): 43 U/L (ref 12–78)
Sodium: 140 mmol/L (ref 136–145)

## 2012-10-14 LAB — TSH: Thyroid Stimulating Horm: 3.79 u[IU]/mL

## 2012-10-15 DIAGNOSIS — R578 Other shock: Secondary | ICD-10-CM

## 2012-10-15 LAB — CBC WITH DIFFERENTIAL/PLATELET
Basophil #: 0.1 10*3/uL (ref 0.0–0.1)
HCT: 34.5 % — ABNORMAL LOW (ref 35.0–47.0)
HGB: 11.8 g/dL — ABNORMAL LOW (ref 12.0–16.0)
Lymphocyte #: 0.1 10*3/uL — ABNORMAL LOW (ref 1.0–3.6)
Lymphocyte %: 0.4 %
MCH: 31.4 pg (ref 26.0–34.0)
MCV: 92 fL (ref 80–100)
Monocyte #: 0.2 x10 3/mm (ref 0.2–0.9)
Monocyte %: 0.7 %
Neutrophil #: 22.7 10*3/uL — ABNORMAL HIGH (ref 1.4–6.5)

## 2012-10-15 LAB — URINALYSIS, COMPLETE
Bacteria: NONE SEEN
Bilirubin,UR: NEGATIVE
Glucose,UR: NEGATIVE mg/dL (ref 0–75)
Ketone: NEGATIVE
Nitrite: NEGATIVE
RBC,UR: 2 /HPF (ref 0–5)
Specific Gravity: 1.006 (ref 1.003–1.030)
Squamous Epithelial: NONE SEEN
WBC UR: <1 /HPF (ref 0–5)

## 2012-10-15 LAB — BASIC METABOLIC PANEL
Anion Gap: 7 (ref 7–16)
Calcium, Total: 7.7 mg/dL — ABNORMAL LOW (ref 8.5–10.1)
Chloride: 106 mmol/L (ref 98–107)
Co2: 29 mmol/L (ref 21–32)
EGFR (Non-African Amer.): 44 — ABNORMAL LOW
Glucose: 141 mg/dL — ABNORMAL HIGH (ref 65–99)
Osmolality: 289 (ref 275–301)
Potassium: 3.7 mmol/L (ref 3.5–5.1)
Sodium: 142 mmol/L (ref 136–145)

## 2012-10-16 LAB — CBC WITH DIFFERENTIAL/PLATELET
Basophil #: 0.1 10*3/uL (ref 0.0–0.1)
Basophil %: 0.4 %
Eosinophil #: 0 10*3/uL (ref 0.0–0.7)
Eosinophil %: 0 %
HCT: 30.9 % — ABNORMAL LOW (ref 35.0–47.0)
Lymphocyte %: 0.5 %
MCH: 30.1 pg (ref 26.0–34.0)
MCV: 92 fL (ref 80–100)
Monocyte #: 0.2 x10 3/mm (ref 0.2–0.9)
Neutrophil #: 18.4 10*3/uL — ABNORMAL HIGH (ref 1.4–6.5)
Neutrophil %: 98.2 %
RBC: 3.35 10*6/uL — ABNORMAL LOW (ref 3.80–5.20)

## 2012-10-16 LAB — BASIC METABOLIC PANEL
Anion Gap: 4 — ABNORMAL LOW (ref 7–16)
BUN: 33 mg/dL — ABNORMAL HIGH (ref 7–18)
Calcium, Total: 8 mg/dL — ABNORMAL LOW (ref 8.5–10.1)
Co2: 31 mmol/L (ref 21–32)
EGFR (African American): 48 — ABNORMAL LOW
EGFR (Non-African Amer.): 42 — ABNORMAL LOW
Glucose: 169 mg/dL — ABNORMAL HIGH (ref 65–99)
Osmolality: 287 (ref 275–301)
Potassium: 3.3 mmol/L — ABNORMAL LOW (ref 3.5–5.1)

## 2012-10-17 LAB — CBC WITH DIFFERENTIAL/PLATELET
Basophil %: 0.5 %
Eosinophil #: 0 10*3/uL (ref 0.0–0.7)
Eosinophil %: 0 %
Lymphocyte #: 0.1 10*3/uL — ABNORMAL LOW (ref 1.0–3.6)
Lymphocyte %: 0.7 %
MCHC: 33.6 g/dL (ref 32.0–36.0)
MCV: 91 fL (ref 80–100)
Monocyte #: 0.1 x10 3/mm — ABNORMAL LOW (ref 0.2–0.9)
Monocyte %: 1 %
Neutrophil #: 12 10*3/uL — ABNORMAL HIGH (ref 1.4–6.5)
RBC: 3.27 10*6/uL — ABNORMAL LOW (ref 3.80–5.20)
RDW: 15.6 % — ABNORMAL HIGH (ref 11.5–14.5)
WBC: 12.3 10*3/uL — ABNORMAL HIGH (ref 3.6–11.0)

## 2012-10-17 LAB — BASIC METABOLIC PANEL
BUN: 35 mg/dL — ABNORMAL HIGH (ref 7–18)
Calcium, Total: 8.2 mg/dL — ABNORMAL LOW (ref 8.5–10.1)
EGFR (African American): 56 — ABNORMAL LOW
EGFR (Non-African Amer.): 49 — ABNORMAL LOW
Glucose: 135 mg/dL — ABNORMAL HIGH (ref 65–99)
Osmolality: 282 (ref 275–301)

## 2012-10-17 LAB — CULTURE, BLOOD (SINGLE)

## 2012-10-17 LAB — MAGNESIUM: Magnesium: 2.1 mg/dL

## 2012-10-17 LAB — POTASSIUM: Potassium: 4.5 mmol/L (ref 3.5–5.1)

## 2012-10-18 LAB — BASIC METABOLIC PANEL
Anion Gap: 7 (ref 7–16)
BUN: 39 mg/dL — ABNORMAL HIGH (ref 7–18)
Calcium, Total: 8.1 mg/dL — ABNORMAL LOW (ref 8.5–10.1)
Co2: 28 mmol/L (ref 21–32)
Creatinine: 0.95 mg/dL (ref 0.60–1.30)
EGFR (African American): >60
EGFR (Non-African Amer.): 58 — ABNORMAL LOW
Glucose: 202 mg/dL — ABNORMAL HIGH (ref 65–99)
Osmolality: 285 (ref 275–301)
Sodium: 135 mmol/L — ABNORMAL LOW (ref 136–145)

## 2012-10-18 LAB — CBC WITH DIFFERENTIAL/PLATELET
Basophil %: 0.1 %
Eosinophil #: 0 10*3/uL (ref 0.0–0.7)
Eosinophil %: 0.5 %
HCT: 27.2 % — ABNORMAL LOW (ref 35.0–47.0)
HGB: 9.2 g/dL — ABNORMAL LOW (ref 12.0–16.0)
Lymphocyte #: 0 10*3/uL — ABNORMAL LOW (ref 1.0–3.6)
MCH: 31 pg (ref 26.0–34.0)
MCHC: 34 g/dL (ref 32.0–36.0)
Monocyte %: 2.2 %
Neutrophil %: 96.4 %
Platelet: 51 10*3/uL — ABNORMAL LOW (ref 150–440)
RDW: 15.9 % — ABNORMAL HIGH (ref 11.5–14.5)
WBC: 5.9 10*3/uL (ref 3.6–11.0)

## 2012-10-19 LAB — CBC WITH DIFFERENTIAL/PLATELET
Eosinophil %: 2.2 %
HCT: 26.6 % — ABNORMAL LOW (ref 35.0–47.0)
HGB: 8.8 g/dL — ABNORMAL LOW (ref 12.0–16.0)
Lymphocyte %: 0.7 %
Monocyte #: 0.1 x10 3/mm — ABNORMAL LOW (ref 0.2–0.9)
Monocyte %: 2.2 %
Neutrophil #: 6.1 10*3/uL (ref 1.4–6.5)
Platelet: 100 10*3/uL — ABNORMAL LOW (ref 150–440)
RBC: 2.9 10*6/uL — ABNORMAL LOW (ref 3.80–5.20)
RDW: 16.1 % — ABNORMAL HIGH (ref 11.5–14.5)

## 2012-10-20 LAB — CBC WITH DIFFERENTIAL/PLATELET
Eosinophil #: 0.1 10*3/uL (ref 0.0–0.7)
Eosinophil %: 1.7 %
HCT: 25.3 % — ABNORMAL LOW (ref 35.0–47.0)
HGB: 8.5 g/dL — ABNORMAL LOW (ref 12.0–16.0)
Lymphocyte #: 0.1 10*3/uL — ABNORMAL LOW (ref 1.0–3.6)
MCH: 30.9 pg (ref 26.0–34.0)
MCV: 92 fL (ref 80–100)
Monocyte #: 0.2 x10 3/mm (ref 0.2–0.9)
Neutrophil #: 6 10*3/uL (ref 1.4–6.5)
Neutrophil %: 93.3 %
Platelet: 95 10*3/uL — ABNORMAL LOW (ref 150–440)
RDW: 15.8 % — ABNORMAL HIGH (ref 11.5–14.5)
WBC: 6.5 10*3/uL (ref 3.6–11.0)

## 2012-10-20 LAB — BASIC METABOLIC PANEL
Anion Gap: 5 — ABNORMAL LOW (ref 7–16)
BUN: 49 mg/dL — ABNORMAL HIGH (ref 7–18)
Calcium, Total: 8 mg/dL — ABNORMAL LOW (ref 8.5–10.1)
Chloride: 99 mmol/L (ref 98–107)
Co2: 31 mmol/L (ref 21–32)
Creatinine: 0.89 mg/dL (ref 0.60–1.30)
EGFR (African American): >60
EGFR (Non-African Amer.): >60
Glucose: 185 mg/dL — ABNORMAL HIGH (ref 65–99)
Osmolality: 288 (ref 275–301)
Sodium: 135 mmol/L — ABNORMAL LOW (ref 136–145)

## 2012-10-21 ENCOUNTER — Ambulatory Visit: Payer: Self-pay | Admitting: Oncology

## 2012-10-21 LAB — CBC WITH DIFFERENTIAL/PLATELET
Basophil #: 0 10*3/uL (ref 0.0–0.1)
Basophil %: 0.1 %
HGB: 9.1 g/dL — ABNORMAL LOW (ref 12.0–16.0)
Lymphocyte %: 1.2 %
MCH: 30.9 pg (ref 26.0–34.0)
MCV: 92 fL (ref 80–100)
Monocyte #: 0.1 x10 3/mm — ABNORMAL LOW (ref 0.2–0.9)
Monocyte %: 1.6 %
Neutrophil #: 6.2 10*3/uL (ref 1.4–6.5)
Neutrophil %: 97.1 %
Platelet: 97 10*3/uL — ABNORMAL LOW (ref 150–440)
RBC: 2.96 10*6/uL — ABNORMAL LOW (ref 3.80–5.20)
RDW: 15.6 % — ABNORMAL HIGH (ref 11.5–14.5)
WBC: 6.4 10*3/uL (ref 3.6–11.0)

## 2012-10-22 LAB — BASIC METABOLIC PANEL
Anion Gap: 4 — ABNORMAL LOW (ref 7–16)
BUN: 32 mg/dL — ABNORMAL HIGH (ref 7–18)
Calcium, Total: 8.4 mg/dL — ABNORMAL LOW (ref 8.5–10.1)
Chloride: 104 mmol/L (ref 98–107)
Co2: 32 mmol/L (ref 21–32)
Creatinine: 0.72 mg/dL (ref 0.60–1.30)
EGFR (Non-African Amer.): >60
Osmolality: 287 (ref 275–301)
Potassium: 3.9 mmol/L (ref 3.5–5.1)
Sodium: 140 mmol/L (ref 136–145)

## 2012-10-22 LAB — CULTURE, BLOOD (SINGLE)

## 2012-10-22 LAB — CBC WITH DIFFERENTIAL/PLATELET
Eosinophil #: 0 10*3/uL (ref 0.0–0.7)
Eosinophil %: 0 %
HCT: 27.8 % — ABNORMAL LOW (ref 35.0–47.0)
HGB: 9.4 g/dL — ABNORMAL LOW (ref 12.0–16.0)
Lymphocyte %: 1.9 %
MCHC: 33.8 g/dL (ref 32.0–36.0)
Monocyte #: 0.1 x10 3/mm — ABNORMAL LOW (ref 0.2–0.9)
Neutrophil #: 6.5 10*3/uL (ref 1.4–6.5)
RDW: 15.8 % — ABNORMAL HIGH (ref 11.5–14.5)
WBC: 6.7 10*3/uL (ref 3.6–11.0)

## 2012-10-24 LAB — TROPONIN I: Troponin-I: <0.02 ng/mL

## 2012-10-24 LAB — PROTIME-INR: INR: 1

## 2012-10-25 LAB — COMPREHENSIVE METABOLIC PANEL
Anion Gap: 3 — ABNORMAL LOW (ref 7–16)
Bilirubin,Total: 0.3 mg/dL (ref 0.2–1.0)
Chloride: 104 mmol/L (ref 98–107)
EGFR (African American): >60
EGFR (Non-African Amer.): >60
Glucose: 118 mg/dL — ABNORMAL HIGH (ref 65–99)
Osmolality: 291 (ref 275–301)
Potassium: 4.1 mmol/L (ref 3.5–5.1)
SGOT(AST): 30 U/L (ref 15–37)
SGPT (ALT): 33 U/L (ref 12–78)

## 2012-10-25 LAB — CBC
HCT: 29.3 % — ABNORMAL LOW (ref 35.0–47.0)
HGB: 9.9 g/dL — ABNORMAL LOW (ref 12.0–16.0)
MCV: 91 fL (ref 80–100)
RBC: 3.22 10*6/uL — ABNORMAL LOW (ref 3.80–5.20)
RDW: 15.7 % — ABNORMAL HIGH (ref 11.5–14.5)

## 2012-10-25 LAB — TROPONIN I: Troponin-I: <0.02 ng/mL

## 2012-10-29 LAB — BASIC METABOLIC PANEL
Anion Gap: 4 — ABNORMAL LOW (ref 7–16)
BUN: 26 mg/dL — ABNORMAL HIGH (ref 7–18)
Calcium, Total: 8 mg/dL — ABNORMAL LOW (ref 8.5–10.1)
Chloride: 100 mmol/L (ref 98–107)
Co2: 32 mmol/L (ref 21–32)
Creatinine: 0.65 mg/dL (ref 0.60–1.30)
EGFR (Non-African Amer.): >60
Glucose: 104 mg/dL — ABNORMAL HIGH (ref 65–99)
Osmolality: 277 (ref 275–301)
Potassium: 3.4 mmol/L — ABNORMAL LOW (ref 3.5–5.1)

## 2012-10-31 LAB — BASIC METABOLIC PANEL
Anion Gap: 6 — ABNORMAL LOW (ref 7–16)
Calcium, Total: 7.8 mg/dL — ABNORMAL LOW (ref 8.5–10.1)
Co2: 32 mmol/L (ref 21–32)
Glucose: 129 mg/dL — ABNORMAL HIGH (ref 65–99)
Potassium: 3.1 mmol/L — ABNORMAL LOW (ref 3.5–5.1)
Sodium: 128 mmol/L — ABNORMAL LOW (ref 136–145)

## 2012-10-31 LAB — CBC WITH DIFFERENTIAL/PLATELET
Basophil #: 0 10*3/uL (ref 0.0–0.1)
Basophil %: 0.3 %
Eosinophil #: 0 10*3/uL (ref 0.0–0.7)
Lymphocyte #: 0.5 10*3/uL — ABNORMAL LOW (ref 1.0–3.6)
Lymphocyte %: 5.2 %
MCHC: 35.9 g/dL (ref 32.0–36.0)
MCV: 90 fL (ref 80–100)
Monocyte #: 0.5 x10 3/mm (ref 0.2–0.9)
Neutrophil #: 8.7 10*3/uL — ABNORMAL HIGH (ref 1.4–6.5)
Neutrophil %: 89.2 %
RBC: 3.42 10*6/uL — ABNORMAL LOW (ref 3.80–5.20)
RDW: 15.6 % — ABNORMAL HIGH (ref 11.5–14.5)
WBC: 9.7 10*3/uL (ref 3.6–11.0)

## 2012-11-01 LAB — BASIC METABOLIC PANEL
Anion Gap: 2 — ABNORMAL LOW (ref 7–16)
BUN: 24 mg/dL — ABNORMAL HIGH (ref 7–18)
Co2: 33 mmol/L — ABNORMAL HIGH (ref 21–32)
Creatinine: 0.47 mg/dL — ABNORMAL LOW (ref 0.60–1.30)
EGFR (Non-African Amer.): >60
Osmolality: 267 (ref 275–301)

## 2012-11-02 ENCOUNTER — Ambulatory Visit: Payer: Self-pay | Admitting: Internal Medicine

## 2012-11-02 LAB — BASIC METABOLIC PANEL
Chloride: 101 mmol/L (ref 98–107)
Creatinine: 0.52 mg/dL — ABNORMAL LOW (ref 0.60–1.30)
EGFR (African American): >60
Glucose: 132 mg/dL — ABNORMAL HIGH (ref 65–99)
Potassium: 3.3 mmol/L — ABNORMAL LOW (ref 3.5–5.1)

## 2012-11-06 ENCOUNTER — Emergency Department: Payer: Self-pay | Admitting: Emergency Medicine

## 2012-11-06 LAB — CBC
HCT: 29.6 % — ABNORMAL LOW (ref 35.0–47.0)
HGB: 10.5 g/dL — ABNORMAL LOW (ref 12.0–16.0)
MCH: 34.2 pg — ABNORMAL HIGH (ref 26.0–34.0)
Platelet: 159 10*3/uL (ref 150–440)
WBC: 5.6 10*3/uL (ref 3.6–11.0)

## 2012-11-06 LAB — BASIC METABOLIC PANEL
BUN: 21 mg/dL — ABNORMAL HIGH (ref 7–18)
Creatinine: 0.48 mg/dL — ABNORMAL LOW (ref 0.60–1.30)
EGFR (African American): >60
Osmolality: 273 (ref 275–301)
Sodium: 132 mmol/L — ABNORMAL LOW (ref 136–145)

## 2012-11-06 LAB — PROTIME-INR
INR: 0.9
Prothrombin Time: 12.5 secs (ref 11.5–14.7)

## 2012-11-06 LAB — CK TOTAL AND CKMB (NOT AT ARMC): CK-MB: 0.8 ng/mL (ref 0.5–3.6)

## 2013-01-03 ENCOUNTER — Ambulatory Visit: Payer: Self-pay | Admitting: Radiation Oncology

## 2013-01-09 ENCOUNTER — Emergency Department: Payer: Self-pay | Admitting: Emergency Medicine

## 2013-01-09 LAB — COMPREHENSIVE METABOLIC PANEL
Albumin: 2.6 g/dL — ABNORMAL LOW (ref 3.4–5.0)
Alkaline Phosphatase: 83 U/L (ref 50–136)
Anion Gap: 5 — ABNORMAL LOW (ref 7–16)
BUN: 32 mg/dL — ABNORMAL HIGH (ref 7–18)
Calcium, Total: 9.6 mg/dL (ref 8.5–10.1)
Chloride: 91 mmol/L — ABNORMAL LOW (ref 98–107)
EGFR (African American): >60
EGFR (Non-African Amer.): >60
Glucose: 97 mg/dL (ref 65–99)
Osmolality: 273 (ref 275–301)
Potassium: 3.8 mmol/L (ref 3.5–5.1)
SGOT(AST): 27 U/L (ref 15–37)
SGPT (ALT): 26 U/L (ref 12–78)
Sodium: 133 mmol/L — ABNORMAL LOW (ref 136–145)
Total Protein: 7.1 g/dL (ref 6.4–8.2)

## 2013-01-09 LAB — URINALYSIS, COMPLETE
Glucose,UR: NEGATIVE mg/dL (ref 0–75)
Ph: 8 (ref 4.5–8.0)
RBC,UR: 5 /HPF (ref 0–5)
Squamous Epithelial: NONE SEEN
WBC UR: 9 /HPF (ref 0–5)

## 2013-01-09 LAB — CBC
HGB: 11.7 g/dL — ABNORMAL LOW (ref 12.0–16.0)
RBC: 3.63 10*6/uL — ABNORMAL LOW (ref 3.80–5.20)
RDW: 17 % — ABNORMAL HIGH (ref 11.5–14.5)
WBC: 11.4 10*3/uL — ABNORMAL HIGH (ref 3.6–11.0)

## 2013-03-15 ENCOUNTER — Emergency Department (HOSPITAL_COMMUNITY)
Admission: EM | Admit: 2013-03-15 | Discharge: 2013-03-16 | Disposition: A | Discharge status: Home / Self Care | Payer: Medicare Other | Attending: Emergency Medicine | Admitting: Emergency Medicine

## 2013-03-15 DIAGNOSIS — Z79899 Other long term (current) drug therapy: Secondary | ICD-10-CM | POA: Insufficient documentation

## 2013-03-15 DIAGNOSIS — K9423 Gastrostomy malfunction: Secondary | ICD-10-CM | POA: Insufficient documentation

## 2013-03-15 DIAGNOSIS — T85598A Other mechanical complication of other gastrointestinal prosthetic devices, implants and grafts, initial encounter: Secondary | ICD-10-CM

## 2013-03-15 DIAGNOSIS — Z431 Encounter for attention to gastrostomy: Secondary | ICD-10-CM

## 2013-03-15 LAB — BASIC METABOLIC PANEL
CO2: 36 mEq/L — ABNORMAL HIGH (ref 19–32)
Calcium: 10.1 mg/dL (ref 8.4–10.5)
Creatinine, Ser: 0.79 mg/dL (ref 0.50–1.10)
Glucose, Bld: 76 mg/dL (ref 70–99)
Sodium: 135 mEq/L (ref 135–145)

## 2013-03-15 LAB — CBC
Hemoglobin: 9.3 g/dL — ABNORMAL LOW (ref 12.0–15.0)
MCH: 30.7 pg (ref 26.0–34.0)
MCV: 90.8 fL (ref 78.0–100.0)
RBC: 3.03 MIL/uL — ABNORMAL LOW (ref 3.87–5.11)

## 2013-03-15 MED ORDER — ALBUTEROL SULFATE (5 MG/ML) 0.5% IN NEBU
2.5000 mg | INHALATION_SOLUTION | RESPIRATORY_TRACT | Status: DC | PRN
  Administered 2013-03-15: 2.5 mg via RESPIRATORY_TRACT
  Filled 2013-03-15: qty 0.5

## 2013-03-15 MED ORDER — ALBUTEROL SULFATE (5 MG/ML) 0.5% IN NEBU
INHALATION_SOLUTION | RESPIRATORY_TRACT | Status: AC
  Administered 2013-03-15: 5 mg
  Filled 2013-03-15: qty 0.5

## 2013-03-15 MED ORDER — ALBUTEROL SULFATE (5 MG/ML) 0.5% IN NEBU
INHALATION_SOLUTION | RESPIRATORY_TRACT | Status: AC
  Filled 2013-03-15: qty 0.5

## 2013-03-15 NOTE — ED Notes (Signed)
Spoke with pt's grandchild Rickia [place] [facility])  And informed her that pt would be admitted to hosp tonight.

## 2013-03-15 NOTE — ED Notes (Signed)
Pt to return to [facility], admission cancelled.  Attempting to contact POA to advise (line busy)

## 2013-03-15 NOTE — ED Notes (Signed)
Pt. arrived via [facility] from [facility] for PEG replacement after being pulled out, is vent dependant.

## 2013-03-15 NOTE — ED Notes (Signed)
Spoke with nurse at [facility] who st's pt has not had any of her medications since last pm.  Informed her that pt would be admitted tonight to hosp.

## 2013-03-15 NOTE — ED Notes (Signed)
Physician into room to attempt replacing PEG with 16 & 14 fr foley.  Unable to place/advance.  Pt tolerated well.  Pt experienced approx 10 sec seizure like activity, eyes up and over to right, body rigid, jaws clamped.  Pt returned to normal for self, followed commands.

## 2013-03-15 NOTE — ED Notes (Signed)
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  Presents today for replacement of PEG tube.

## 2013-03-15 NOTE — ED Notes (Signed)
POA aware pt is returning to [facility] hospital tonight.  Awaiting transport to take patient back.  No change in pt condition.

## 2013-03-15 NOTE — ED Provider Notes (Signed)
CSN: [id number]     Arrival date & time 03/15/13  1915 History     First MD Initiated Contact with Patient 03/15/13 1917     Chief Complaint  Patient presents with  . Feeding Intolerance   (Consider location/radiation/quality/duration/timing/severity/associated sxs/prior Treatment) Patient is a 78 y.o. female presenting with general illness. The history is provided by the patient.  Illness Location:  At nursing home, G-tube fell out roughly 12 hours ago Quality:  Moderate Severity:  Moderate Onset quality:  Sudden Duration:  12 hours Timing:  Constant Progression:  Unchanged Chronicity:  Recurrent Context:  G-tube fell out Relieved by:  Nothing Worsened by:  Nothing Ineffective treatments:  Nothing Associated symptoms: no cough, no fever and no shortness of breath     No past medical history on file. No past surgical history on file. No family history on file. History  Substance Use Topics  . Smoking status: Not on file  . Smokeless tobacco: Not on file  . Alcohol Use: Not on file   OB History   No data available     Review of Systems  Constitutional: Negative for fever.  Respiratory: Negative for cough and shortness of breath.   All other systems reviewed and are negative.    Allergies  Review of patient's allergies indicates not on file.  Home Medications   Current Outpatient Rx  Name  Route  Sig  Dispense  Refill  . amLODipine (NORVASC) 10 MG tablet   Tube   Give 10 mg by tube daily.         Marland Kitchen anastrozole (ARIMIDEX) 1 MG tablet   Tube   Give 1 mg by tube daily.         Marland Kitchen ascorbic acid (VITAMIN C) 250 MG tablet   Tube   Give 250 mg by tube every 12 (twelve) hours.         Marland Kitchen atorvastatin (LIPITOR) 10 MG tablet   Tube   Give 10 mg by tube daily.         Marland Kitchen b complex vitamins tablet   Tube   Give 1 tablet by tube daily.         Marland Kitchen escitalopram (LEXAPRO) 10 MG tablet   Tube   Give 10 mg by tube daily.         . ferrous sulfate 300  (60 FE) MG/5ML syrup   Oral   Take 300 mg by mouth daily.         . haloperidol (HALDOL) 2 MG tablet   Tube   Give 2 mg by tube every 8 (eight) hours as needed (anxiety).         . hydrochlorothiazide (HYDRODIURIL) 25 MG tablet   Tube   Give 25 mg by tube daily.         Marland Kitchen levETIRAcetam (KEPPRA) 100 MG/ML solution   Oral   Take 750 mg/kg by mouth 2 (two) times daily.         Marland Kitchen LORazepam (ATIVAN) 2 MG/ML injection   Intravenous   Inject 0.5 mg into the vein every 8 (eight) hours as needed for anxiety.         Marland Kitchen losartan (COZAAR) 100 MG tablet   Tube   Give 100 mg by tube daily.         . Omeprazole 2 MG/ML SUSP   Tube   Give 20 mg by tube daily.         . propranolol (INDERAL) 80 MG tablet  Tube   Give 80 mg by tube every 12 (twelve) hours.         . traZODone (DESYREL) 50 MG tablet   Tube   Give 50 mg by tube at bedtime.          BP 137/60  Pulse 76  Temp(Src) 99.1 F (37.3 C) (Oral)  SpO2 100% Physical Exam  Nursing note and vitals reviewed. Constitutional: She is oriented to person, place, and time. She appears well-developed and well-nourished. No distress.  HENT:  Head: Normocephalic and atraumatic.  Eyes: EOM are normal. Pupils are equal, round, and reactive to light.  Neck: Normal range of motion. Neck supple.  Trach in place, c/d/i  Cardiovascular: Normal rate and regular rhythm.  Exam reveals no friction rub.   No murmur heard. Pulmonary/Chest: Effort normal and breath sounds normal. No respiratory distress. She has no wheezes. She has no rales.  Abdominal: Soft. She exhibits no distension. There is no tenderness. There is no rebound.  G-tube site with mild TTP. No other abdominal tenderness.   Musculoskeletal: Normal range of motion. She exhibits no edema.  Neurological: She is alert and oriented to person, place, and time.  Skin: She is not diaphoretic.    ED Course   Procedures (including critical care time)  Labs Reviewed -  No data to display No results found. 1. Attention to G-tube   2. Feeding tube dysfunction, initial encounter     MDM   Patient is a 70 rolled female who resides at a ventilator dependent hospital. She presents for PEG tube replacement. Site is a well-appearing, no discharge or redness. Will replace with Foley.\ Unable to place a Foley. I attempted a 16 and 18-gauge Foley without success. Patient was given IV access and labs were checked. Labs normal. I attempted to admit patient for hydration and replacement the morning, however on speaking with [facility] patient has a general surgeon I will visit her in 2 days and the doctors there can give her all of her medications and hydration and her IV. Ill except her as a return patient. She is discharged back to [facility].  Dagmar Hait, MD 03/16/13 [phone]

## 2013-06-30 ENCOUNTER — Encounter (HOSPITAL_COMMUNITY): Payer: Self-pay | Admitting: Emergency Medicine

## 2013-06-30 ENCOUNTER — Emergency Department (HOSPITAL_COMMUNITY): Payer: Medicare Other

## 2013-06-30 ENCOUNTER — Emergency Department (HOSPITAL_COMMUNITY)
Admission: EM | Admit: 2013-06-30 | Discharge: 2013-06-30 | Disposition: A | Discharge status: Skilled Nursing Facility | Payer: Medicare Other | Attending: Emergency Medicine | Admitting: Emergency Medicine

## 2013-06-30 DIAGNOSIS — G9340 Encephalopathy, unspecified: Secondary | ICD-10-CM

## 2013-06-30 DIAGNOSIS — Z79899 Other long term (current) drug therapy: Secondary | ICD-10-CM | POA: Insufficient documentation

## 2013-06-30 DIAGNOSIS — E785 Hyperlipidemia, unspecified: Secondary | ICD-10-CM | POA: Insufficient documentation

## 2013-06-30 DIAGNOSIS — J441 Chronic obstructive pulmonary disease with (acute) exacerbation: Secondary | ICD-10-CM | POA: Insufficient documentation

## 2013-06-30 DIAGNOSIS — Z66 Do not resuscitate: Secondary | ICD-10-CM | POA: Insufficient documentation

## 2013-06-30 DIAGNOSIS — Z8673 Personal history of transient ischemic attack (TIA), and cerebral infarction without residual deficits: Secondary | ICD-10-CM | POA: Insufficient documentation

## 2013-06-30 DIAGNOSIS — Z9911 Dependence on respirator [ventilator] status: Secondary | ICD-10-CM | POA: Insufficient documentation

## 2013-06-30 DIAGNOSIS — G40909 Epilepsy, unspecified, not intractable, without status epilepticus: Secondary | ICD-10-CM | POA: Insufficient documentation

## 2013-06-30 DIAGNOSIS — T40605A Adverse effect of unspecified narcotics, initial encounter: Secondary | ICD-10-CM | POA: Insufficient documentation

## 2013-06-30 DIAGNOSIS — Z853 Personal history of malignant neoplasm of breast: Secondary | ICD-10-CM | POA: Insufficient documentation

## 2013-06-30 DIAGNOSIS — I5030 Unspecified diastolic (congestive) heart failure: Secondary | ICD-10-CM | POA: Insufficient documentation

## 2013-06-30 DIAGNOSIS — T50905A Adverse effect of unspecified drugs, medicaments and biological substances, initial encounter: Secondary | ICD-10-CM

## 2013-06-30 DIAGNOSIS — I10 Essential (primary) hypertension: Secondary | ICD-10-CM | POA: Insufficient documentation

## 2013-06-30 HISTORY — DX: Hyperlipidemia, unspecified: E78.5

## 2013-06-30 HISTORY — DX: Chronic obstructive pulmonary disease, unspecified: J44.9

## 2013-06-30 HISTORY — DX: Unspecified diastolic (congestive) heart failure: I50.30

## 2013-06-30 HISTORY — DX: Essential (primary) hypertension: I10

## 2013-06-30 HISTORY — DX: Malignant neoplasm of unspecified site of unspecified female breast: C50.919

## 2013-06-30 HISTORY — DX: Unspecified convulsions: R56.9

## 2013-06-30 LAB — CBC WITH DIFFERENTIAL/PLATELET
Eosinophils Absolute: 0 10*3/uL (ref 0.0–0.7)
Eosinophils Relative: 0 % (ref 0–5)
Lymphs Abs: 0.7 10*3/uL (ref 0.7–4.0)
MCH: 30.6 pg (ref 26.0–34.0)
MCV: 91.4 fL (ref 78.0–100.0)
Monocytes Absolute: 0.3 10*3/uL (ref 0.1–1.0)
Monocytes Relative: 3 % (ref 3–12)
Platelets: 160 10*3/uL (ref 150–400)
RBC: 2.78 MIL/uL — ABNORMAL LOW (ref 3.87–5.11)

## 2013-06-30 LAB — URINALYSIS, ROUTINE W REFLEX MICROSCOPIC
Nitrite: NEGATIVE
Specific Gravity, Urine: 1.01 (ref 1.005–1.030)
Urobilinogen, UA: 0.2 mg/dL (ref 0.0–1.0)
pH: 8.5 — ABNORMAL HIGH (ref 5.0–8.0)

## 2013-06-30 LAB — POCT I-STAT 3, ART BLOOD GAS (G3+)
O2 Saturation: 99 %
TCO2: 35 mmol/L (ref 0–100)
pCO2 arterial: 45.4 mmHg — ABNORMAL HIGH (ref 35.0–45.0)
pH, Arterial: 7.474 — ABNORMAL HIGH (ref 7.350–7.450)
pO2, Arterial: 152 mmHg — ABNORMAL HIGH (ref 80.0–100.0)

## 2013-06-30 LAB — RAPID URINE DRUG SCREEN, HOSP PERFORMED
Amphetamines: NOT DETECTED
Barbiturates: NOT DETECTED
Benzodiazepines: NOT DETECTED
Tetrahydrocannabinol: NOT DETECTED

## 2013-06-30 LAB — COMPREHENSIVE METABOLIC PANEL
BUN: 27 mg/dL — ABNORMAL HIGH (ref 6–23)
Calcium: 8.8 mg/dL (ref 8.4–10.5)
Creatinine, Ser: 0.74 mg/dL (ref 0.50–1.10)
GFR calc Af Amer: >90 mL/min (ref >90)
Glucose, Bld: 153 mg/dL — ABNORMAL HIGH (ref 70–99)
Sodium: 132 mEq/L — ABNORMAL LOW (ref 135–145)
Total Protein: 5.5 g/dL — ABNORMAL LOW (ref 6.0–8.3)

## 2013-06-30 LAB — POCT I-STAT TROPONIN I: Troponin i, poc: 0.02 ng/mL (ref 0.00–0.08)

## 2013-06-30 LAB — URINE MICROSCOPIC-ADD ON

## 2013-06-30 MED ORDER — IOHEXOL 350 MG/ML SOLN
50.0000 mL | Freq: Once | INTRAVENOUS | Status: AC | PRN
Start: 2013-06-30 — End: 2013-06-30
  Administered 2013-06-30: 50 mL via INTRAVENOUS

## 2013-06-30 MED ORDER — NALOXONE HCL 1 MG/ML IJ SOLN
1.0000 mg | Freq: Once | INTRAMUSCULAR | Status: AC
  Administered 2013-06-30: 1 mg via INTRAVENOUS
  Filled 2013-06-30: qty 2

## 2013-06-30 MED ORDER — SODIUM CHLORIDE 0.9 % IV SOLN
Freq: Once | INTRAVENOUS | Status: AC
  Administered 2013-06-30: 200 mL/h via INTRAVENOUS

## 2013-06-30 MED ORDER — IOHEXOL 350 MG/ML SOLN
150.0000 mL | Freq: Once | INTRAVENOUS | Status: DC | PRN
  Filled 2013-06-30: qty 150

## 2013-06-30 MED ORDER — SODIUM CHLORIDE 0.9 % IJ SOLN
10.0000 mL | INTRAMUSCULAR | Status: DC | PRN
  Administered 2013-06-30: 10 mL

## 2013-06-30 NOTE — ED Notes (Signed)
Spoke with CT not a power port need a 20G IV. Attempted unable unsuccessful paged IV team.

## 2013-06-30 NOTE — ED Notes (Signed)
IV team at bedside 

## 2013-06-30 NOTE — ED Notes (Signed)
Second nurse attempted a IV unsuccessful while waiting for IV team.

## 2013-06-30 NOTE — Progress Notes (Signed)
Porta-cath has good blood return it was flushed and capped with NS. Nicole Jones

## 2013-06-30 NOTE — ED Notes (Signed)
EDP at bedside talking to family members.

## 2013-06-30 NOTE — ED Provider Notes (Signed)
CSN: [id number]     Arrival date & time 06/30/13  0913 History   First MD Initiated Contact with Patient 06/30/13 0915     Chief Complaint  Patient presents with  . Altered Mental Status    HPI  Patient presents with an episode of dyspnea.  She is a patient of a ventilator dependent hospital, [facility] [place]. 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 She is fairly independent with end-stage COPD. She is DO NOT RESUSCITATE. She had episode respiratory distress his morning. She was given hand-held nebulizer in line to her trach. Is also given 2 mg of IV morphine x2. Pressure dropped to 50 she was transferred by ambulance here. Pressure improved with with 100 200 cc of fluid en route. Was well oxygenated with bag assisted ventilations.  No recent fever. No recent other episodes.  Past Medical History  Diagnosis Date  . Seizures   . COPD (chronic obstructive pulmonary disease)   . Breast cancer   . Hypertension   . Hyperlipidemia   . Diastolic heart failure    History reviewed. No pertinent past surgical history. History reviewed. No pertinent family history. History  Substance Use Topics  . Smoking status: Never Smoker   . Smokeless tobacco: Not on file  . Alcohol Use: No   OB History   Grav Para Term Preterm Abortions TAB SAB Ect Mult Living                 Review of Systems  Unable to perform ROS: Dementia   level V caveat due to encephalopathy  Allergies  Review of patient's allergies indicates no known allergies.  Home Medications   Current Outpatient Rx  Name  Route  Sig  Dispense  Refill  . amLODipine (NORVASC) 10 MG tablet   Tube   Give 10 mg by tube daily.         Marland Kitchen anastrozole (ARIMIDEX) 1 MG tablet   Tube   Give 1 mg by tube daily.         Marland Kitchen ascorbic acid (VITAMIN C) 250 MG tablet   Tube   Give 250 mg by tube every 12 (twelve) hours.         Marland Kitchen atorvastatin (LIPITOR) 10 MG tablet   Tube   Give 10 mg by  tube daily.         Marland Kitchen b complex vitamins tablet   Tube   Give 1 tablet by tube daily.         Marland Kitchen escitalopram (LEXAPRO) 10 MG tablet   Tube   Give 10 mg by tube daily.         . ferrous sulfate 300 (60 FE) MG/5ML syrup   Oral   Take 300 mg by mouth daily.         . haloperidol (HALDOL) 2 MG tablet   Tube   Give 2 mg by tube every 8 (eight) hours as needed (anxiety).         . hydrochlorothiazide (HYDRODIURIL) 25 MG tablet   Tube   Give 25 mg by tube daily.         Marland Kitchen levETIRAcetam (KEPPRA) 100 MG/ML solution   Oral   Take 750 mg/kg by mouth 2 (two) times daily.         Marland Kitchen LORazepam (ATIVAN) 2 MG/ML injection   Intravenous   Inject 0.5 mg into the vein every 8 (eight) hours as needed for anxiety.         Marland Kitchen  losartan (COZAAR) 100 MG tablet   Tube   Give 100 mg by tube daily.         . Omeprazole 2 MG/ML SUSP   Tube   Give 20 mg by tube daily.         Marland Kitchen PAROEX 0.12 % solution   Mouth Rinse   15 mLs by Mouth Rinse route every 12 (twelve) hours.         . propranolol (INDERAL) 80 MG tablet   Tube   Give 80 mg by tube every 12 (twelve) hours.         . traZODone (DESYREL) 50 MG tablet   Tube   Give 50 mg by tube at bedtime.          BP 129/79  Pulse 71  Temp(Src) 98.9 F (37.2 C) (Rectal)  Resp 13  SpO2 100% Physical Exam  Constitutional:  Thin unresponsive female. Tracheostomy and ventilator.  HENT:  Atraumatic  Eyes:  Bilateral pupils pinpoint.  Negative doll's eyes.   Neck:  Trachea midline. Neck supple  Cardiovascular: S1 normal, S2 normal and normal heart sounds.   Sinus rhythm on the monitor  Pulmonary/Chest:  SIMV on a ventilator. Clear breath sounds.  With minimal pressure support she delivers good volumes and returns good volumes.  Abdominal:  Soft flat normoactive bowel sounds  Neurological: GCS eye subscore is 1. GCS verbal subscore is 1. GCS motor subscore is 1.  GCS is 3T  Skin:  Multiple healed decubiti. No  obvious active decubiti or infection    ED Course  Procedures (including critical care time) Labs Review Labs Reviewed  CBC WITH DIFFERENTIAL - Abnormal; Notable for the following:    WBC 11.5 (*)    RBC 2.78 (*)    Hemoglobin 8.5 (*)    HCT 25.4 (*)    RDW 16.0 (*)    Neutrophils Relative % 91 (*)    Neutro Abs 10.5 (*)    Lymphocytes Relative 6 (*)    All other components within normal limits  COMPREHENSIVE METABOLIC PANEL - Abnormal; Notable for the following:    Sodium 132 (*)    Chloride 93 (*)    Glucose, Bld 153 (*)    BUN 27 (*)    Total Protein 5.5 (*)    Albumin 2.2 (*)    Total Bilirubin 0.2 (*)    GFR calc non Af Amer 81 (*)    All other components within normal limits  URINALYSIS, ROUTINE W REFLEX MICROSCOPIC - Abnormal; Notable for the following:    APPearance TURBID (*)    pH 8.5 (*)    Protein, ur 100 (*)    Leukocytes, UA MODERATE (*)    All other components within normal limits  POCT I-STAT 3, BLOOD GAS (G3+) - Abnormal; Notable for the following:    pH, Arterial 7.474 (*)    pCO2 arterial 45.4 (*)    pO2, Arterial 152.0 (*)    Bicarbonate 33.3 (*)    Acid-Base Excess 9.0 (*)    All other components within normal limits  URINE CULTURE  URINE MICROSCOPIC-ADD ON  URINE RAPID DRUG SCREEN (HOSP PERFORMED)  BLOOD GAS, ARTERIAL  CG4 I-STAT (LACTIC ACID)  POCT I-STAT TROPONIN I   Imaging Review Ct Angio Head W/cm &/or Wo Cm  06/30/2013   CLINICAL DATA:  Unresponsive.  Question basilar artery thrombosis.  EXAM: CT ANGIOGRAPHY HEAD AND NECK  TECHNIQUE: Multidetector CT imaging of the head and neck was performed using  the standard protocol during bolus administration of intravenous contrast. Multiplanar CT image reconstructions including MIPs were obtained to evaluate the vascular anatomy. Carotid stenosis measurements (when applicable) are obtained utilizing NASCET criteria, using the distal internal carotid diameter as the denominator.  CONTRAST:  50mL  OMNIPAQUE IOHEXOL 350 MG/ML SOLN  COMPARISON:  06/30/2013 head CT.  FINDINGS: CTA HEAD FINDINGS  Calcified plaque internal carotid artery pre cavernous cavernous segment with mild to moderate narrowing.  No evidence of basilar thrombosis.  No aneurysm or vascular malformation noted.  Mild irregularity of medium and small size vessel involving the anterior and posterior circulation.  Marked small vessel disease type changes without CT findings of large acute infarct.  Global atrophy. Ventricular prominence probably related to atrophy rather than hydrocephalus.  No intracranial hemorrhage.  No intracranial enhancing lesion.  Polypoid opacification right maxillary sinus. Partial opacification right sphenoid sinus.  Review of the MIP images confirms the above findings.  CTA NECK FINDINGS  Atherosclerotic type changes of the aortic arch with irregular plaque.  Atherosclerotic type changes of the origin of the great vessels. This is most notable involving the left subclavian artery where there is irregular plaque with mild to slightly moderate narrowing.  Plaque with moderate narrowing of the origin of the left vertebral artery. Beyond this region, no high-grade stenosis of the left vertebral artery.  Moderate narrowing right subclavian artery.  Evaluation of the proximal right vertebral artery is limited by streak artifact caused by injected contrast. Beyond this region the right vertebral artery is ectatic without high-grade stenosis.  Long segment calcified plaque proximal right internal carotid artery with 84% diameter stenosis.  Calcified plaques left carotid bifurcation/proximal left internal carotid artery with 50% diameter stenosis proximal left internal carotid artery.  Prominent emphysematous changes upper lung zones with minimal parenchymal changes which may represent scarring/ atelectasis. Nodules upper lung zones measure up to 3.4 mm. If the patient is at high risk for bronchogenic carcinoma, follow-up chest  CT at 1year is recommended. If the patient is at low risk, no follow-up is needed. This recommendation follows the consensus statement: Guidelines for Management of Small Pulmonary Nodules Detected on CT Scans: A Statement from the Fleischner Society as published in Radiology 2005; 237:395-400. Tracheostomy tube tip midline.  Review of the MIP images confirms the above findings.  IMPRESSION: No evidence of basilar thrombosis.  Mild irregularity of medium and small size vessel involving the anterior and posterior intracranial circulation.  Marked small vessel disease type changes without CT findings of large acute infarct.  Long segment calcified plaque proximal right internal carotid artery with 84% diameter stenosis.  Please see above for additional findings.  These results were called by telephone at the time of interpretation on 06/30/2013 at 1:49 PM to Dr. Amada Jupiter, who verbally acknowledged these results. It appears the patient's unresponsiveness may been related to medicine.   Electronically Signed   By: Bridgett Larsson M.D.   On: 06/30/2013 14:13   Ct Head Wo Contrast  06/30/2013   CLINICAL DATA:  Headache.  EXAM: CT HEAD WITHOUT CONTRAST  TECHNIQUE: Contiguous axial images were obtained from the base of the skull through the vertex without intravenous contrast.  COMPARISON:  None.  FINDINGS: Suboptimal positioning. The ventricles and cisterns are within normal. There is moderate symmetric low-attenuation within the periventricular subcortical white matter suggesting moderate chronic ischemic microvascular disease. Cannot exclude small old high right frontoparietal and high right parieto-occipital infarcts. There is no mass, mass effect, shift of midline structures or acute  hemorrhage. Mild patchy low-attenuation over the posterior periphery of the cerebellum bilaterally as cannot exclude acute versus chronic ischemic change. There is mild chronic inflammatory change of the right maxillary sinus. .   IMPRESSION: Minimal patchy low-attenuation over the posterior periphery of the cerebellar hemisphere bilaterally as cannot exclude acute versus chronic ischemic change.  Moderate bilateral chronic ischemic microvascular disease and possible old high right frontoparietal and parieto-occipital infarcts.  Minimal chronic sinus inflammatory disease.   Electronically Signed   By: Elberta Fortis M.D.   On: 06/30/2013 10:17   Ct Angio Neck W/cm &/or Wo/cm  06/30/2013   CLINICAL DATA:  Unresponsive.  Question basilar artery thrombosis.  EXAM: CT ANGIOGRAPHY HEAD AND NECK  TECHNIQUE: Multidetector CT imaging of the head and neck was performed using the standard protocol during bolus administration of intravenous contrast. Multiplanar CT image reconstructions including MIPs were obtained to evaluate the vascular anatomy. Carotid stenosis measurements (when applicable) are obtained utilizing NASCET criteria, using the distal internal carotid diameter as the denominator.  CONTRAST:  50mL OMNIPAQUE IOHEXOL 350 MG/ML SOLN  COMPARISON:  06/30/2013 head CT.  FINDINGS: CTA HEAD FINDINGS  Calcified plaque internal carotid artery pre cavernous cavernous segment with mild to moderate narrowing.  No evidence of basilar thrombosis.  No aneurysm or vascular malformation noted.  Mild irregularity of medium and small size vessel involving the anterior and posterior circulation.  Marked small vessel disease type changes without CT findings of large acute infarct.  Global atrophy. Ventricular prominence probably related to atrophy rather than hydrocephalus.  No intracranial hemorrhage.  No intracranial enhancing lesion.  Polypoid opacification right maxillary sinus. Partial opacification right sphenoid sinus.  Review of the MIP images confirms the above findings.  CTA NECK FINDINGS  Atherosclerotic type changes of the aortic arch with irregular plaque.  Atherosclerotic type changes of the origin of the great vessels. This is most notable  involving the left subclavian artery where there is irregular plaque with mild to slightly moderate narrowing.  Plaque with moderate narrowing of the origin of the left vertebral artery. Beyond this region, no high-grade stenosis of the left vertebral artery.  Moderate narrowing right subclavian artery.  Evaluation of the proximal right vertebral artery is limited by streak artifact caused by injected contrast. Beyond this region the right vertebral artery is ectatic without high-grade stenosis.  Long segment calcified plaque proximal right internal carotid artery with 84% diameter stenosis.  Calcified plaques left carotid bifurcation/proximal left internal carotid artery with 50% diameter stenosis proximal left internal carotid artery.  Prominent emphysematous changes upper lung zones with minimal parenchymal changes which may represent scarring/ atelectasis. Nodules upper lung zones measure up to 3.4 mm. If the patient is at high risk for bronchogenic carcinoma, follow-up chest CT at 1year is recommended. If the patient is at low risk, no follow-up is needed. This recommendation follows the consensus statement: Guidelines for Management of Small Pulmonary Nodules Detected on CT Scans: A Statement from the Fleischner Society as published in Radiology 2005; 237:395-400. Tracheostomy tube tip midline.  Review of the MIP images confirms the above findings.  IMPRESSION: No evidence of basilar thrombosis.  Mild irregularity of medium and small size vessel involving the anterior and posterior intracranial circulation.  Marked small vessel disease type changes without CT findings of large acute infarct.  Long segment calcified plaque proximal right internal carotid artery with 84% diameter stenosis.  Please see above for additional findings.  These results were called by telephone at the time of interpretation on  06/30/2013 at 1:49 PM to Dr. Amada Jupiter, who verbally acknowledged these results. It appears the patient's  unresponsiveness may been related to medicine.   Electronically Signed   By: Bridgett Larsson M.D.   On: 06/30/2013 14:13   Dg Chest Port 1 View  06/30/2013   CLINICAL DATA:  Altered mental status  EXAM: PORTABLE CHEST - 1 VIEW  COMPARISON:  None available  FINDINGS: Tracheostomy projects in expected location. Right subclavian port catheter extends to the distal SVC. No pneumothorax. Lungs are clear. Lateral costophrenic angles excluded. Heart size normal. Atheromatous aorta.  IMPRESSION: Postoperative changes above without acute disease.   Electronically Signed   By: Oley Balm M.D.   On: 06/30/2013 09:54    EKG Interpretation   None       MDM   1. Medication reaction, initial encounter    On arrival she was unresponsive with pinpoint pupils. His multiple previous CNS hemorrhages.  Her meds she does not with narcotics. Ultimately I was able to get a history of her being given the morphine. She was given a milligram of Narcan. She does have unresponsive to her baseline which is occasionally opening her eyes responding to voice responding to noises in the room is in chronic tremulous activity of her right upper and lower extremity.  She had a CT of her head shows multiple old infarcts. No obvious acute hemorrhage. CT of the head and neck which shows no basilar thrombosis and no sign of acute infarct. EKG shows no acute other maladies. Labs are reassuring urine is not infected.  Think this does represent a morphine response.  I spoke with Dr. Jason Fila at [facility]. She'll be transferred back to her ongoing care facility    Roney Marion, MD 06/30/13 1450

## 2013-06-30 NOTE — Consult Note (Signed)
Neurology Consultation Reason for Consult: Unresponsiveness Referring Physician: Rolland Porter  CC: unresponsive  History is obtained from: Nursing home staff  HPI: Nicole Jones is a 78 y.o. female with a history of seizures a . She began having respiratory distress this morning and subsequently had "bronchospasm" per the nursing staff. No seizure activity was seen. Due to the respiratory distress she became unresponsive and morphine was given which precipitated a drop in her BP. When her BP was not coming up,   She takes keppra 750mg  BID for a history of seizures taht have occurred since she has had multiple intracranial bleeds over the past three years.   Per nursing staff, at baseline, she will occasionally wake up and say "what?" but little other interaction. She has frequent abnormal movements of the right side.   LKW: last pm tpa given?: no, outside of window    ROS: Unable to assess secondary to patient's altered mental status.    Past Medical History  Diagnosis Date  . Seizures   . COPD (chronic obstructive pulmonary disease)   . Breast cancer   . Hypertension   . Hyperlipidemia   . Diastolic heart failure     Family History: Unable to assess secondary to patient's altered mental status.    Social History: Tob: Unable to assess secondary to patient's altered mental status.    Exam: Current vital signs: BP 78/57  Pulse 80  Temp(Src) 98.9 F (37.2 C) (Rectal)  Resp 10  SpO2 100% Vital signs in last 24 hours: Temp:  [98.9 F (37.2 C)] 98.9 F (37.2 C) (11/27 0933) Pulse Rate:  [67-84] 80 (11/27 1200) Resp:  [10-16] 10 (11/27 1200) BP: (55-138)/(44-91) 78/57 mmHg (11/27 1200) SpO2:  [98 %-100 %] 100 % (11/27 1200) FiO2 (%):  [30 %-40 %] 30 % (11/27 1133)  General: in bed, trach in place CV: RRR Mental Status: Patient is awake, alert, she does appear to fixate the examiner. She does not follow commands or speak Cranial Nerves: II: Visual Fields  difficult as the patient does not clearly blink from either direction Pupils are equal, round, and reactive to light.  Discs are difficult to visualize. III,IV, VI: Does not clearly track V/VII: Blinks to eyelid stimulation VIII, X, XI, XII: Unable to assess secondary to patient's altered mental status.  Motor: Patient moves her bilateral upper extremities purposefully. She has rhythmic tremor right much greater than left that is modifiable with repositioning noxious stimulus Sensory: Response to noxious stimuli x 4  Deep Tendon Reflexes: 2+ and symmetric in the biceps and patellae.  Cerebellar: Unable to assess secondary to patient's altered mental status.  Gait: Unable to assess secondary to patient's altered mental status.   I have reviewed labs in epic and the results pertinent to this consultation are: Mild leukocytosis  I have reviewed the images obtained:CT head- extensive white matter change    Impression: Altered mental status in the setting of respiratory failure with subsequent worsening of mental status and hypotension after a demonstration of morphine. Improvement following a dose of Narcan would argue for involvement in her presentation.  Per history her exam is change in the setting of intracranial hemorrhages. Her exam appears worse then what I would expect based on her CT, however given the temporal correlation between hemorrhages and the worsening exam though this is likely the cause. In any case this is apparently her baseline for multiple months.  Though she does have a history of seizures, I am not certain that  she has had a seizure today and would not modify her antiepileptic therapy based on only today's events.  Recommendations: 1) hypotension management per emergency room 2) if family confirms return to baseline, then no further recommendations from a neurological perspective.   Ritta Slot, MD [facility] [phone]  If 7pm- 7am,  please page neurology on call at [phone].

## 2013-06-30 NOTE — ED Notes (Signed)
Received pt via [facility] from [facility] with c/o found unresponsive by staff this morning at 0800. Unknown Last seen normal time. Pt has a trach and is a DNR status. Initial BP 54/32. Pt given 250 ML NS bolus by [facility]. Pt given 2mg  of Morphine at [facility] at 0800, a continuous neb treatment and chest x-ray done at [facility]. [facility] accessed pt right port a cath.

## 2013-06-30 NOTE — ED Notes (Signed)
Returned from CT with EDP and Neurology at bedside.

## 2013-06-30 NOTE — ED Notes (Signed)
When attempt IV right AC patient responded to pain moving upper extremities. Prior to IV attempt Family members at bedside stated patient normal shakes lower extremities and patient intermittently shaking right lower extremity.

## 2013-06-30 NOTE — ED Notes (Signed)
After IV team established an IV transported patient to CT with Nurse and Respiratory.

## 2013-06-30 NOTE — ED Notes (Signed)
CT chest not completed.

## 2013-07-01 LAB — GLUCOSE, CAPILLARY: Glucose-Capillary: 158 mg/dL — ABNORMAL HIGH (ref 70–99)

## 2013-07-03 ENCOUNTER — Telehealth (HOSPITAL_COMMUNITY): Payer: Self-pay | Admitting: Emergency Medicine

## 2013-07-03 LAB — URINE CULTURE: Colony Count: >=100000

## 2013-07-03 NOTE — ED Notes (Signed)
Post ED Visit - Positive Culture Follow-up  Culture report reviewed by antimicrobial stewardship pharmacist: []  Wes Dulaney, Pharm.D., BCPS []  Celedonio Miyamoto, Pharm.D., BCPS []  Georgina Pillion, Pharm.D., BCPS []  [place], [address].D., BCPS, AAHIVP [x]  Estella Husk, Pharm.D., BCPS, AAHIVP  Positive urine culture Per pharmacist, fax results to [facility].  Kylie A Holland 07/03/2013, 12:54 PM

## 2013-07-03 NOTE — ED Notes (Signed)
Urine culture faxed to [facility] [phone] after Mya LPN, pt's nurse @ [facility] notified.

## 2013-08-04 DEATH — deceased

## 2014-02-23 IMAGING — PT NM PET TUM IMG RESTAG (PS) SKULL BASE T - THIGH
1 series · 2 of 2 positions shown · non-contrast
Comparison: none

REASON FOR EXAM: breast CA staging
COMMENTS:

PROCEDURE:     PET - PET/CT RESTG BREAST CA  - November 14, 2011 [DATE]
RESULT:     Indication: Breast cancer
Radiopharmaceutical: 11.86 mCi F18-FDG, intravenously.
TECHNIQUE: Imaging was performed from the skull base to the mid-thigh using
routine PET/CT acquisition protocol.
Injection site: Right arm
Time of FDG injection: 4942 hours
Serum glucose: 78 mg/dL
Time of imaging: 6505 hours
Comparison studies: CT chest 11/04/2011

[Series 1022: results mm oncology reading · 1.31mm/px · 2 of 2 slices shown]
[im 1/2]
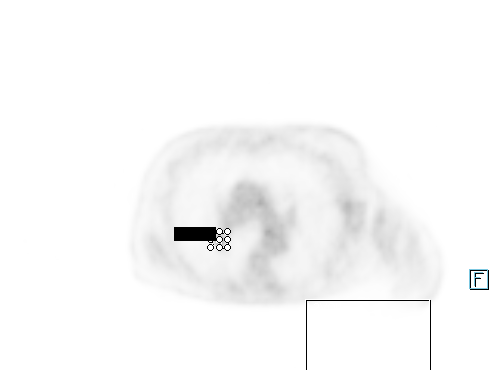
[im 2/2]
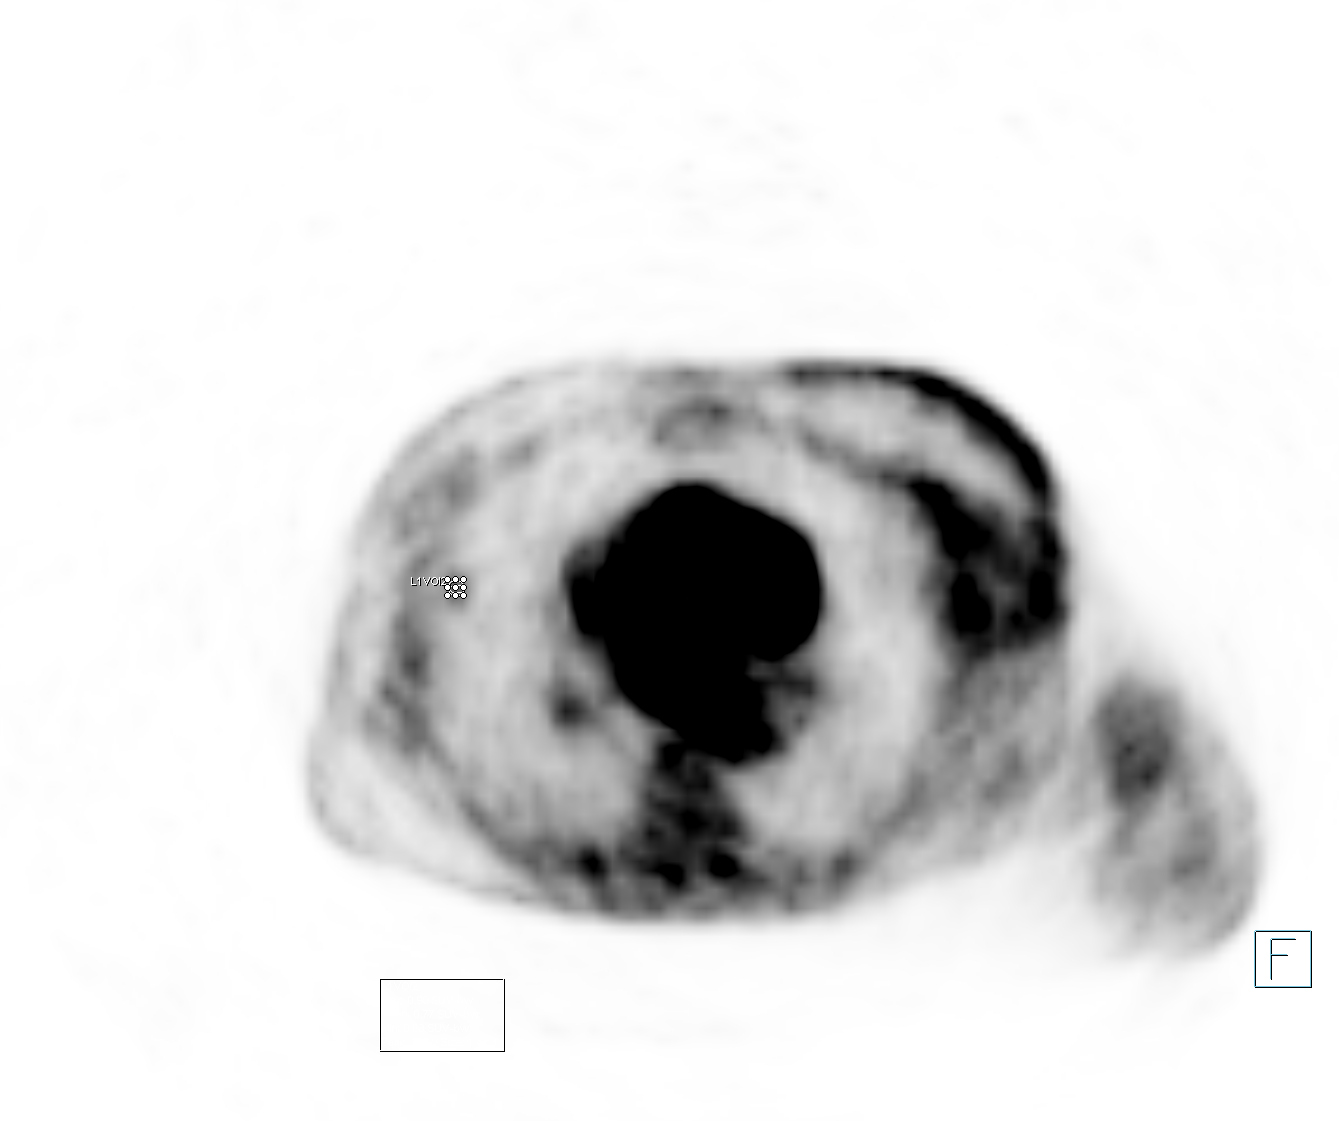

[2 of 2 positions shown; findings below may reference images not displayed]

FINDINGS: HEAD AND NECK:

There is no abnormal hypermetabolic activity in the head and neck. There is
no cervical soft tissue mass or lymphadenopathy.

CHEST:

There is postsurgical change in the left breast and along the left chest
wall. There is evidence of prior axillary node dissection. There is a 4.2 x
2.7 cm axillary fluid collection along the chest wall likely resenting a
postoperative seroma. There is mild hypermetabolic activity along the left
chest wall likely representing post surgical changes.

There is a 12 mm hypermetabolic right upper lobe perihilar nodule with an
SUV max of 4.8 and an SUV average of 2.8 . There is a 14 mm hypermetabolic
right middle lobe pulmonary nodule with an SUV max of 0.9 and an SUV average
of 0.5 .

There bilateral severe emphysematous changes. There is no pleural effusion
or pneumothorax. The heart size is normal. There is no pericardial effusion.

There no pathologically enlarged mediastinal, hilar, or axillary lymph
nodes.

There is no lytic or sclerotic osseous lesion.

ABDOMEN/PELVIS:

The liver demonstrates no focal abnormality. The gallbladder is
unremarkable. The spleen demonstrates no focal abnormality. There is a left
renal cyst. The right kidney, adrenal glands, pancreas are normal. The
bladder is unremarkable.

The unopacified bowel is unremarkable. There is no pneumoperitoneum,
pneumatosis, or portal venous gas. There is no abdominal or pelvic free
fluid. There is no lymphadenopathy.

The abdominal aorta is normal in caliber.

There bilateral L5 pars intra to 2 hours defects with grade 1
anterolisthesis of L5 on S1. There is no lytic or sclerotic osseous lesion.
IMPRESSION: 1. Postsurgical changes in the left breast and left axillary region.

2. There is a right upper lobe hypermetabolic pulmonary nodule concerning
for metastatic disease versus synchronous primary neoplasm. There is a right
middle lobe ulnar nodule with low level metabolic activity less than
mediastinal activity. Differential considerations include an
inflammatory/infectious nodule versus malignancy versus granulomatous
disease.

## 2014-03-20 IMAGING — US US EXTREM UP VENOUS*L*
1 series · 17 of 24 positions shown · non-contrast
Comparison: none

REASON FOR EXAM: Left Upper Arm Extr Swelling Pain Eval DVT Pt Had Port
Place on Rt Side
COMMENTS:

PROCEDURE:     US  - US DOPPLER UP EXTR LEFT  - December 09, 2011  [DATE]
RESULT:     Comparison: None
TECHNIQUE: Multiple gray-scale, color-flow Doppler, and spectral waveform
tracings of the left internal jugular vein, brachiocephalic vein, and
proximal deep veins of the left upper extremity from the antecubital fossa
to the brachiocephalic vein are presented for review.

[Series 1: us extrem up venous*left* · 17 of 42 slices shown]
[im 1/42]
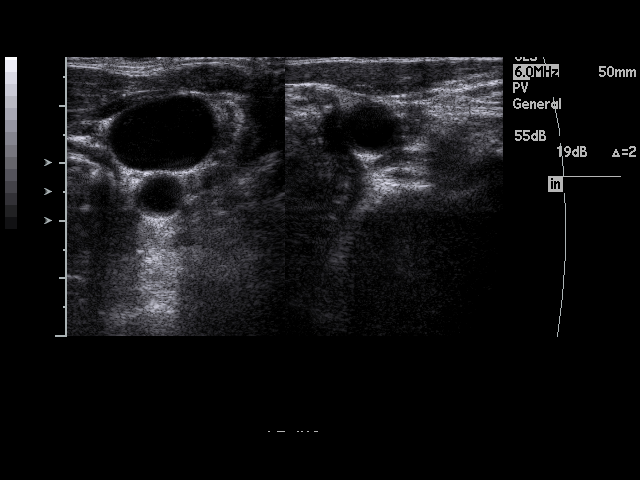
[im 4/42]
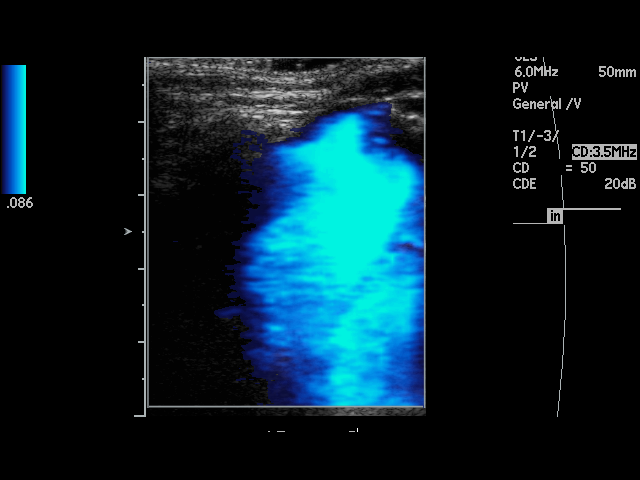
[im 6/42]
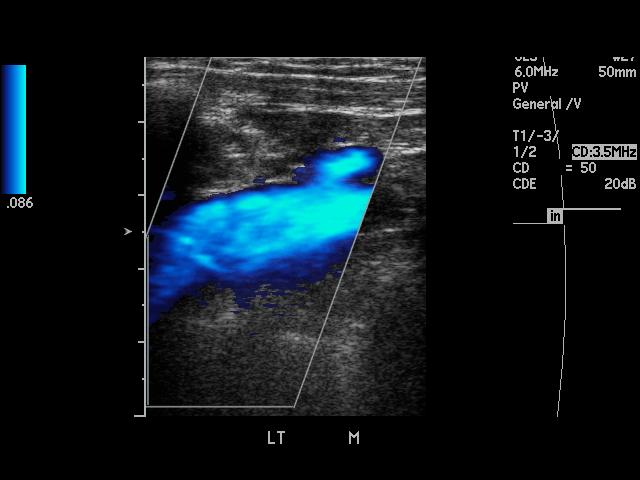
[im 8/42]
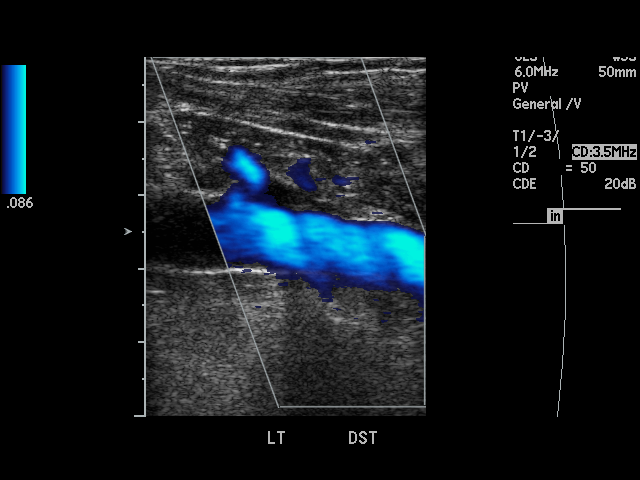
[im 11/42]
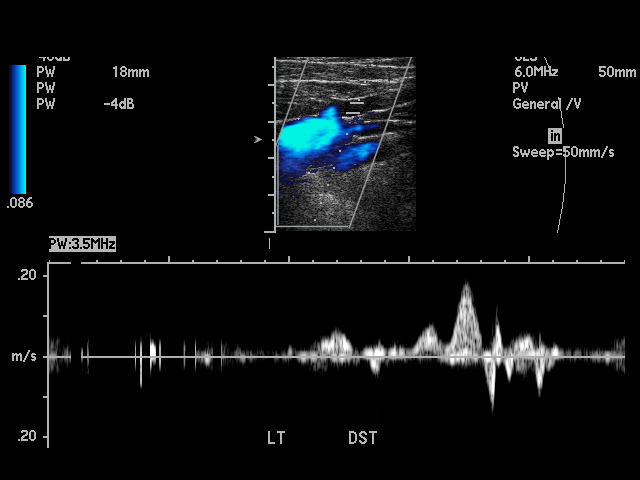
[im 13/42]
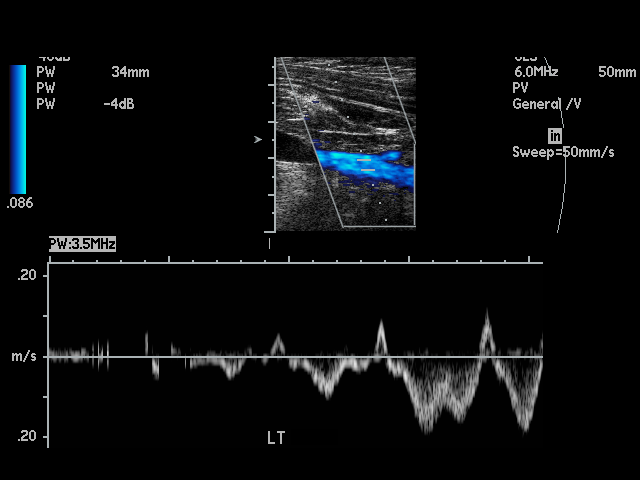
[im 17/42]
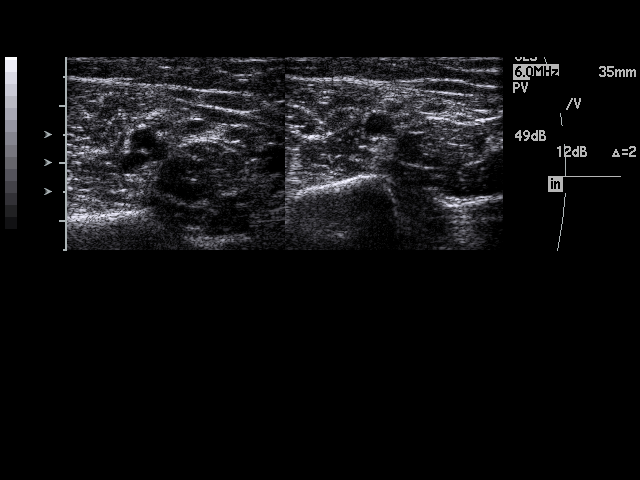
[im 18/42]
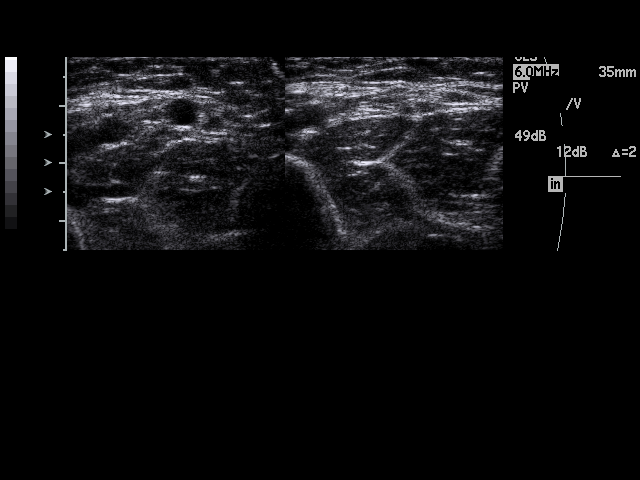
[im 22/42]
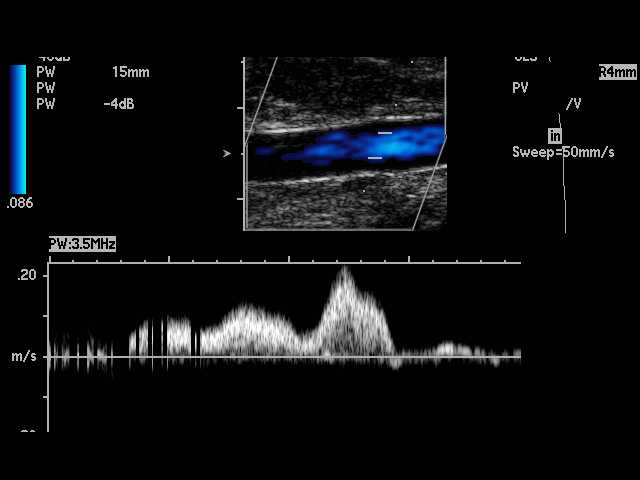
[im 24/42]
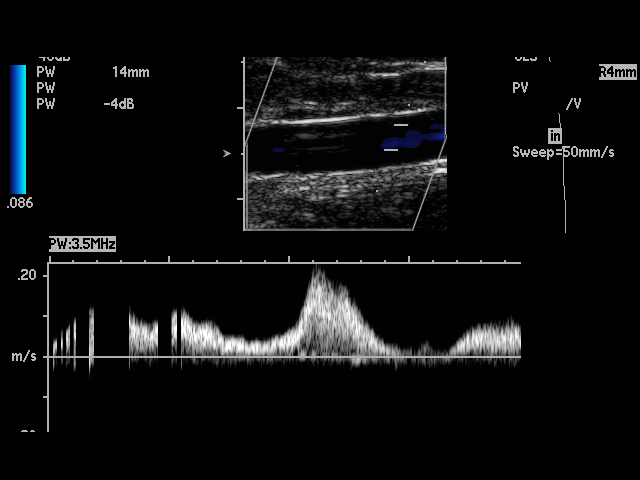
[im 25/42]
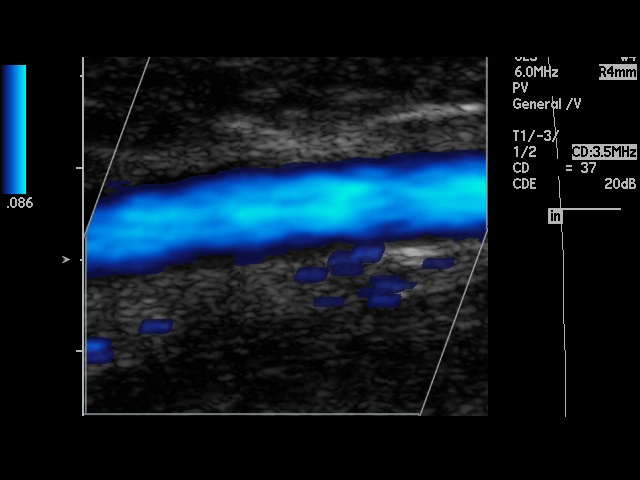
[im 29/42]
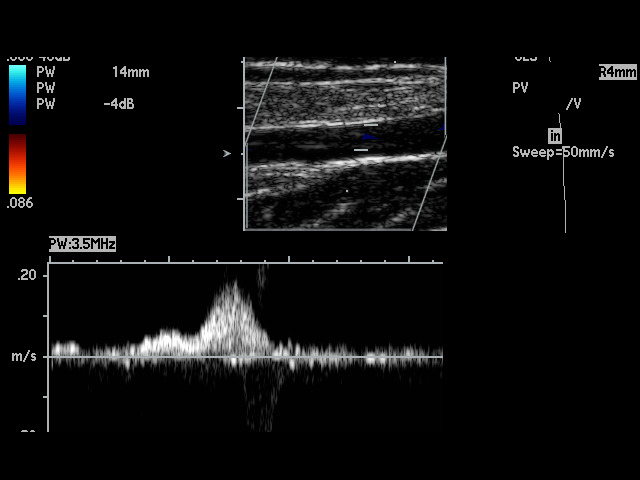
[im 31/42]
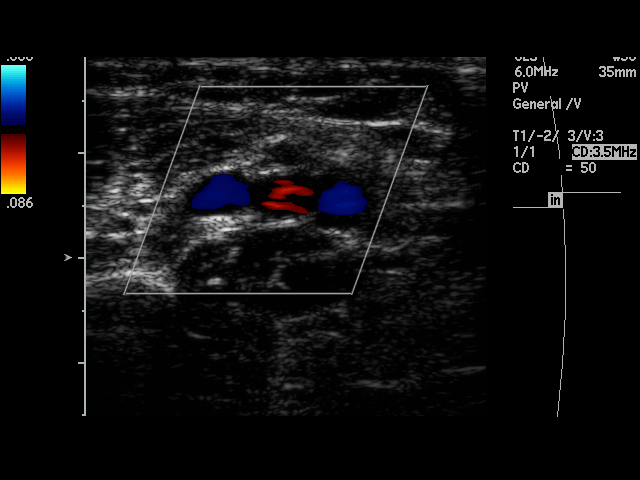
[im 34/42]
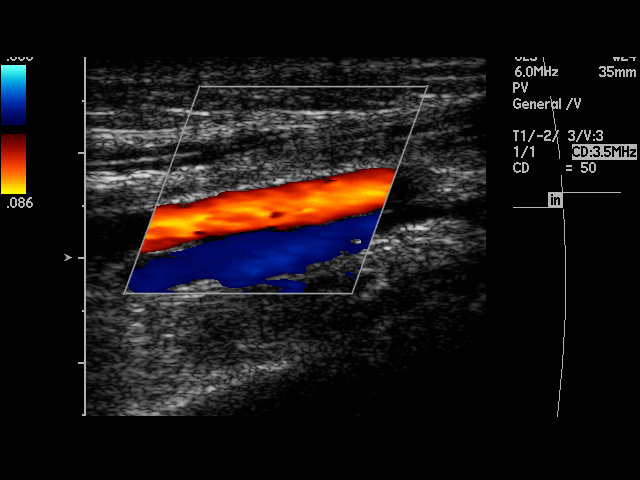
[im 36/42]
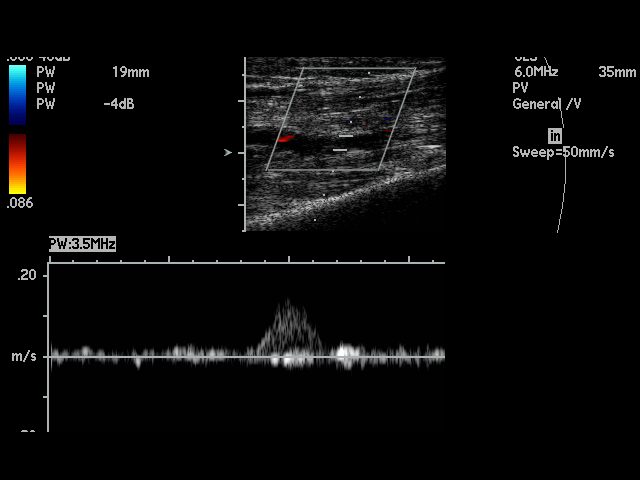
[im 38/42]
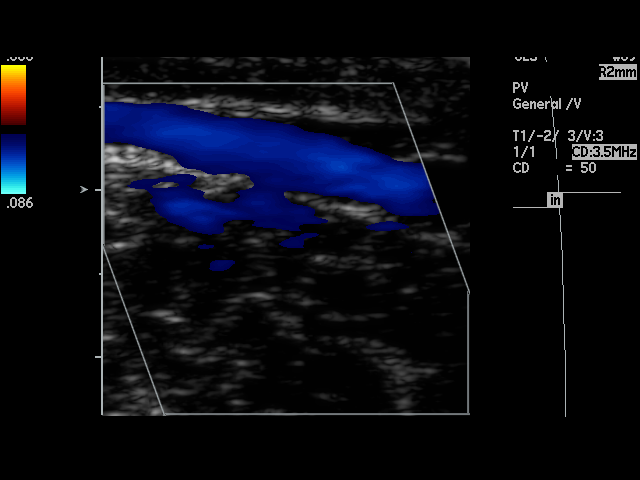
[im 42/42]
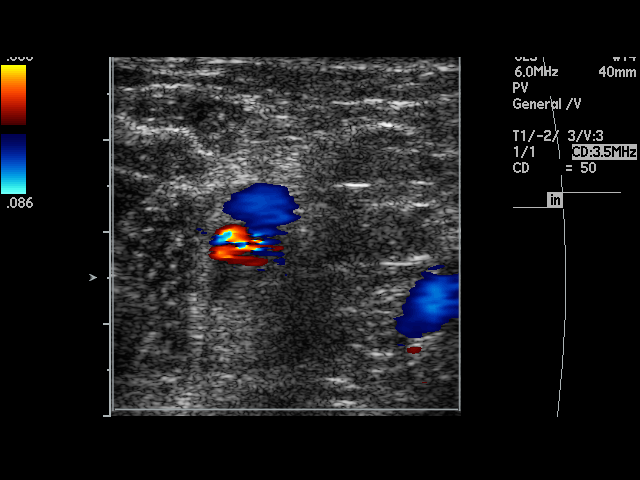

[17 of 24 positions shown; findings below may reference images not displayed]

FINDINGS: There is normal blood flow within the left internal jugular vein, visualized
portions of the brachiocephalic vein, and left upper extremity deep venous
system from the antecubital fossa to the brachiocephalic vein.
IMPRESSION: No sonographic evidence of deep venous thrombosis in the left upper
extremity.

## 2014-11-21 NOTE — Consult Note (Signed)
Reason for Visit: This 80 year old Female patient presents to the clinic for initial evaluation of  Breast cancer .   Referred by Dr. Oliva Bustard.  Diagnosis:   Chief Complaint/Diagnosis   80 year old female status post wide local excision and axillary node dissection for a stage III (P. T2 PN 2 MX) invasive mammary carcinoma ER positive PR negative HER-2/neu nonamplified status post adjuvant chemotherapy. Patient has a slightly avid lung nodule which is being followed.   Pathology Report Pathology report reviewed    Imaging Report Mammograms ultrasound and PET CT scan reviewed    Referral Report Clinical notes reviewed    Planned Treatment Regimen Adjuvant radiation therapy to her left breast and peripheral lymphatics    HPI   patient is a 80 year old female who presents with an abnormal mammogram of the left breast. She underwent a biopsy by Dr. Hervey Ard positive for invasive mammary carcinoma ER positive PR slightly positive at 1% and HER-2/neu negative. Workup including PET CT scan showed a right middle lobe nodule which was slightly PET avid.Gen. wide local excision for a 2.5 cm invasive memory carcinoma with mixed lobular and ductal features. Angiolymphatic invasion was present. Margins were clear but close at 1 mm on the caudal excision margin.9/11 lymph nodesymph nodes examined hadmetastatic diseasewith extracapsular extension noted. She tolerated her surgery well. She subsequently undergone adjuvant chemotherapyconsisting of Cytoxanand Taxotere. She tolerated her treatments well. She is scheduled for a repeat CT scan to evaluate the lung nodule next week and a followup appointment with Dr. Oliva Bustard. She specifically denies breast tenderness cough or bone pain. She has developed some lymphedema of the left upper extremity and is has a lymphedema sleeve present at this time.  Past Hx:    Left Breast Cancer: 2013   Tobacco Use:    Seizures: after stroke, 2011   CVA/Stroke: 2011    depression:    pneumonia:    DVT - Deep Vein Thrombosis:    COPD:    Hypercholesterolemia:    CAD:    GERD:    HTN:    Emphysema:    Asthma:    AortoIliac Bypass:    Trach Placement and removal: 2010   Hysterectomy - Total:    Wide local excision with mastoplasty, axillary dissection: Mar 2013   right cataract extraction:    Hysterectomy - Total:   Past, Family and Social History:   Past Medical History positive    Cardiovascular hyperlipidemia; hypertension; Aortic bypass    Respiratory COPD; pneumonia; Emphysema    Gastrointestinal GERD    Neurological/Psychiatric CVA    Past Surgical History Total hysterectomy, cataract excision, Mass. to plasty with axillary dissection as described above    Past Medical History Comments DVT    Family History positive    Family History Comments Family history of colon cancer, family history of adult onset diabetes and sickle cell anemia    Social History positive    Social History Comments Greater than 50-pack-year smoking history    Additional Past Medical and Surgical History Accompanied by family member today   Allergies:   NKDA: None  Home Meds:  Home Medications: Medication Instructions Status  ondansetron 4 mg tablet 1 tab(s) orally every 8 hours x 10 days, As Needed- for Nausea, Vomiting  Active  propranolol 20 mg oral tablet 1 tab(s) orally 2 times a day Active  omeprazole 20 mg oral delayed release capsule 1 cap(s) orally 2 times a day Active  Symbicort 80 mcg-4.5 mcg/inh inhalation aerosol  2 puff(s) inhaled 2 times a day Active  Tylenol Caplet Extra Strength 500 mg oral tablet 1 to 2 tab(s) orally 2 times a week as needed for arm pain.  Active  amlodipine 10 mg oral tablet 1 tab(s) orally once a day Active  Diovan 160 mg oral tablet 1 tab(s) orally once a day Active  Klor-Con M20 oral tablet, extended release 1 tab(s) orally once a day Active  ProAir HFA 90 mcg/inh inhalation aerosol 2 puff(s)  inhaled every 6 hours as needed for shortness of breath and or wheezing Active  metoclopramide 10 mg oral tablet 1 tab(s) orally 2 times a day Active  Spiriva 18 mcg inhalation capsule inhale 1 cap(s) via handihaler once a day (in the morning) Active  theophylline 200 mg oral tablet, extended release 2 tab(s) orally 2 times a day Active  montelukast 10 mg oral tablet 1 tab(s) orally once a day Active  lovastatin 40 mg oral tablet 1 tab(s) orally once a day (at bedtime) Active  albuterol-ipratropium 2.5 mg-0.5 mg/3 mL inhalation solution 1 vial (3 milliliters) via nebulizer 2 to 3 times a day Active   Review of Systems:   General negative    Performance Status (ECOG) 0    Skin negative    Breast see HPI    Ophthalmologic see HPI    ENMT negative    Respiratory and Thorax see HPI    Cardiovascular see HPI    Gastrointestinal negative    Genitourinary negative    Musculoskeletal negative    Neurological negative    Psychiatric negative    Hematology/Lymphatics negative    Endocrine negative    Allergic/Immunologic negative    Review of Systems   Patient denies any weight loss, fatigue, weakness, fever, chills or night sweats. Patient denies any loss of vision, blurred vision. Patient denies any ringing  of the ears or hearing loss. No irregular heartbeat. Patient denies heart murmur or history of fainting. Patient denies any chest pain or pain radiating to her upper extremities. Patient denies any shortness of breath, difficulty breathing at night, cough or hemoptysis. Patient denies any swelling in the lower legs. Patient denies any nausea vomiting, vomiting of blood, or coffee ground material in the vomitus. Patient denies any stomach pain. Patient states has had normal bowel movements no significant constipation or diarrhea. Patient denies any dysuria, hematuria or significant nocturia. Patient denies any problems walking, swelling in the joints or loss of balance. Patient  denies any skin changes, loss of hair or loss of weight. Patient denies any excessive worrying or anxiety or significant depression. Patient denies any problems with insomnia. Patient denies excessive thirst, polyuria, polydipsia. Patient denies any swollen glands, patient denies easy bruising or easy bleeding. Patient denies any recent infections, allergies or URI. Patient "s visual fields have not changed significantly in recent time.  Nursing Notes:  Nursing Vital Signs and Chemo Nursing Nursing Notes: *CC Vital Signs Flowsheet:   15-Aug-13 11:35   Temp Temperature 96.9   Pulse Pulse 84   Respirations Respirations 20   SBP SBP 122   DBP DBP 69   Pain Scale (0-10)  1   Current Weight (kg) (kg) 52.6   Height (cm) centimeters 154.9   BSA (m2) 1.4   Physical Exam:  General/Skin/HEENT:   General normal    Skin normal    Eyes normal    ENMT normal    Head and Neck normal    Additional PE Well-developed female in NAD. She  has a Port-A-Cath placed in the right anterior chest wall. No dominant mass or nodularity is noted in either breast into position examined. Her lumpectomy cavity on the left has some scarring present. She has no axillary or supraclavicular adenopathy. She is wearing a lymphedema sleeve on her left upper extremity. Lungs are clear to A&P cardiac examination shows regular rate and rhythm.   Breasts/Resp/CV/GI/GU:   Respiratory and Thorax normal    Cardiovascular normal    Gastrointestinal normal    Genitourinary normal   MS/Neuro/Psych/Lymph:   Musculoskeletal normal    Neurological normal    Lymphatics normal   Assessment and Plan:  Impression:   at least stage III invasive mammary carcinoma left breast status post wide local excision and axillary node dissection and adjuvant chemotherapy in 80 year old female. Question of M1 disease based on lung pathology is being explored.  Plan:   this time patient will have a repeat CT scan of her chest for  delineation of the right lung nodule. Should this disappeared we will assume chemotherapy caused a response. Before I go ahead with radiation therapy which I would recommend to her left breast and peripheral lymphatics based on her poor prognostic factors including majority of lymph nodes in her axilla been positive from a macro metastatic disease. Would plan on delivering 5000 cGy to her left breast and peripheral lymphatics. Would also boost or scar another 1980 gray based on the millimeter close margin. Risks and benefits of treatment including skin reaction, fatigue, and possible exacerbation of her or to present lymphedema were explained in detail to the patient. I will review her repeat CT scan with Dr. Oliva Bustard when it becomes available and will make final decision whether that we are dealing with stage IV disease or not or possibility of biopsy her upper lung lesion would need to be carried out. All this was explained to the patient. I have given her a followup appointment after Labor Day. Patient knows to call sooner if any problems develop.  I would like to take this opportunity to thank you for allowing me to continue to participate in this patient's care.  CC Referral:   cc: Dr. Devona Konig   Electronic Signatures: Baruch Gouty, Roda Shutters (MD)  (Signed 03-Sep-13 15:32)  Authored: HPI, Diagnosis, Past Hx, PFSH, Allergies, Home Meds, ROS, Nursing Notes, Physical Exam, Encounter Assessment and Plan, CC Referring Physician   Last Updated: 03-Sep-13 15:32 by Armstead Peaks (MD)

## 2014-11-24 NOTE — Op Note (Signed)
PATIENT NAME:  [name], Nicole Jones MR#:  [id number] DATE OF BIRTH:  08-08-1934  DATE OF PROCEDURE:  10/20/2012  PREOPERATIVE DIAGNOSIS: Respiratory failure.   POSTOPERATIVE DIAGNOSIS: Respiratory failure.  PROCEDURE PERFORMED: Tracheostomy tube planned.    SURGEON: Jerene Bears, MD  ANESTHESIA: General endotracheal anesthesia.   ESTIMATED BLOOD LOSS: Less than 10 mL.   INTRAVENOUS FLUIDS: Please see anesthesia record.   COMPLICATIONS: None.   DRAINS/STENT PLACEMENT: Size 8 Shiley cuffed tracheostomy tube.   SPECIMENS: None.   INDICATIONS FOR PROCEDURE: The patient is a 80 year old female with history of COPD, recurrent respiratory failure, history of previous tracheostomy tube, needing tracheostomy tube due to respiratory failure.   OPERATIVE FINDINGS: Size 8 Shiley cuffed tracheostomy tube successfully placed. Significant amount of external scarring on the anterior wall of the trachea   DESCRIPTION OF PROCEDURE: The patient was identified in holding, benefits and risks of the procedure were reviewed. The patient was taken to the operating room via CCU bed. General endotracheal anesthesia with continued. The patient was appropriately positioned with a shoulder roll. Previous tracheostomy tube site was identified, and 3 mL of 1% lidocaine with 1:100,000 epinephrine was injected into the previous tracheostomy tube site. At this time, the patient was prepped and draped in a sterile fashion. A 15-blade scalpel was used to make a horizontal skin incision at her previous tracheostomy tube site. Dissection was carried through subcutaneous scarring down to the level of the remaining strap muscles. These were divided vertically and retracted horizontally. At this time, the previous thyroid had already been divided, and at this time, a large amount of scarring of the cartilage was encountered that extended all the way up to just below the cricoid. This area was skeletonized. The scarring was  approximately a centimeter thick. At this time, the decision was made to make the tracheostomy below the previous scar line. Therefore, after FiO2 was dropped and anesthesia dropped the endotracheal tube, an 11-blade scalpel was used to make a horizontal tracheal incision just below the previous tracheal incision site between tracheal rings 3 and 4. Curved heavy Mayo scissors were used to make a superior cut involving some of the scarred trachea to remove that from below superiorly. This revealed an endotracheal tube in place. This was slowly withdrawn. As soon as the tip was visualized, a size 8 Shiley cuffed tracheostomy tube was placed through the tracheotomy site, and this was held in place in a 4-point fashion with silk sutures, and adequate ventilation with oxygenation of 100% and good CO2 return was noted. Cuff was inflated, and at this time, Surgicel was placed within the wound bed bilaterally and a Dale collar was placed as well as a Betadine-soaked pledget. At this time, care of the patient was transferred to anesthesia, where the patient was in guarded condition.   ____________________________ Jerene Bears, MD ccv:OSi D: 10/20/2012 11:45:14 ET T: 10/20/2012 11:58:34 ET JOB#: [id number]  cc: Jerene Bears, MD, <Dictator> Jerene Bears MD ELECTRONICALLY SIGNED 10/29/2012 17:50

## 2014-11-24 NOTE — Consult Note (Signed)
Brief Consult Note: Diagnosis: PEG placement.   Patient was seen by consultant.   Comments: Patient evaluated. Agree that very likely a PEG will be needed. Mild thrombocytopenia. Will discuss with the family and tentatively schedule for Monday.  Electronic Signatures: Jill Side (MD)  (Signed 21-Mar-14 16:05)  Authored: Brief Consult Note   Last Updated: 21-Mar-14 16:05 by Jill Side (MD)

## 2014-11-24 NOTE — Consult Note (Signed)
Chief Complaint:  Subjective/Chief Complaint Overall same.   VITAL SIGNS/ANCILLARY NOTES: **Vital Signs.:   23-Mar-14 09:00  Vital Signs Type Routine  Pulse Pulse 70  Pulse source if not from Vital Sign Device per cardiac monitor  Respirations Respirations 19  Systolic BP Systolic BP 387  Diastolic BP (mmHg) Diastolic BP (mmHg) 88  Mean BP 110  BP Source  if not from Vital Sign Device non-invasive  Pulse Ox % Pulse Ox % 100  Pulse Ox Activity Level  At rest  Oxygen Delivery Ventilator Assisted  Pulse Ox Heart Rate 72   Brief Assessment:  Additional Physical Exam Abdomen is soft and benign.   Assessment/Plan:  Assessment/Plan:  Assessment Respiratory failure with need for continued ventilation. S/P trach. No absolute contraindications for PEG placement.   Plan Will plan on performin PEG tomorrow. Procedure discussed with POA in detail and she is in full agreement.   Electronic Signatures: Jill Side (MD)  (Signed 23-Mar-14 10:19)  Authored: Chief Complaint, VITAL SIGNS/ANCILLARY NOTES, Brief Assessment, Assessment/Plan   Last Updated: 23-Mar-14 10:19 by Jill Side (MD)

## 2014-11-24 NOTE — H&P (Signed)
PATIENT NAME:  Nicole Jones, KILBRIDE MR#:  [id number] DATE OF BIRTH:  [birth year]  DATE OF ADMISSION:  09/21/2012  PRIMARY CARE PROVIDER: She used to go to the [facility] but is no longer a patient there due to financial issues.  EMERGENCY DEPARTMENT REFERRING PHYSICIAN: Gretchen Short. Beather Arbour, MD   CHIEF COMPLAINT: Shortness of breath.   HISTORY OF PRESENT ILLNESS: The patient is a 80 year old African American female with chronic respiratory failure, history of COPD, who is not on any O2 at home, who reports that she started having shortness of breath for the past few days which has progressively gotten worse. She also has noticed wheezing and also productive greenish sputum. She has felt feverish but has not checked her temperature. She denies any chest pains. Denies any lower extremity swelling. Denies any abdominal pain, nausea, vomiting or diarrhea. She does report that she has noticed her left breast to be enlarged over the past few months. She does have a history of left breast cancer and has had chemo and radiation there.   PAST MEDICAL HISTORY:  1.  History of left breast cancer, status post left mastectomy.  2.  History of seizure disorder.  3.  History of CVA with no residual effects.  4.  History of pneumonia.  5.  COPD.  6.  History of DVT in the past.  7.  History of hyperlipidemia.  8.  Hypertension.   PAST SURGICAL HISTORY:  1.  Status post aortoiliac bypass.  2.  History of trach placement and removal.  3.  Status post hysterectomy.  4.  Status post left mastectomy.  5.  Status post right cataract surgery.   ALLERGIES: None.   HOME MEDICATIONS: Albuterol/Atrovent nebs 2 to 3 times a day as needed, amlodipine 10 daily, anastrozole 1 mg daily, Diovan 160 mg daily, Klor-Con 20 mEq daily, lovastatin 40 daily, Reglan 10 mg 1 tab p.o. b.i.d., mirtazapine 15 mg at bedtime, montelukast 10 mg daily (which is Singulair), omeprazole 20 mg 1 tab p.o. b.i.d., propranolol 20 mg 1 tab p.o. b.i.d.,  Spiriva 18 mcg daily, Symbicort 2 puffs 2 times per day.   SOCIAL HISTORY: Continues to smoke 1 pack per day. No alcohol or drug use. Lives with her son.   FAMILY HISTORY: Positive for hypertension.   REVIEW OF SYSTEMS:    CONSTITUTIONAL: Complains of generalized weakness, fatigue. No significant weight loss or weight gain.  HEENT: Has a history of cataracts. Denies any double vision. No drainage from her eyes. No difficulty with vision.  ENT: Denies any ringing in the ears. No difficulty hearing. Denies any nasal drainage. No epistaxis. Denies any difficulty swallowing.  CARDIOVASCULAR: Denies any chest pains, palpitations. No orthopnea. No syncope. No arrhythmias.  PULMONARY: Complains of cough, shortness of breath. Has history of COPD. History of pneumonia in the past. No hemoptysis.  GASTROINTESTINAL: Denies any nausea, vomiting, diarrhea. No hematemesis or hematochezia.  GENITOURINARY: Denies any frequency, urgency or hesitancy.  ENDOCRINE: Denies any polyuria or nocturia. No hypothyroidism or diabetes.  SKIN: Denies any rash.  LYMPHATICS: Denies any lymph node enlargement.  VASCULAR: Denies any claudication symptoms.  PSYCHIATRIC: Denies any anxiety.  NEUROLOGICAL: He does have a history of previous CVA but no seizures.   PHYSICAL EXAMINATION:  VITAL SIGNS: Temperature 99.2, pulse 119, respirations 24, blood pressure 154/70, O2 of 95%.  GENERAL: The patient is a thin African American female in mild respiratory distress with some mild accessory muscle usage.  HEENT: Head atraumatic, normocephalic. Pupils equally round,  reactive to light and accommodation. There is no conjunctival pallor. No scleral icterus. Nasal exam shows no drainage or ulceration. Oropharynx is clear without any exudates.  NECK: No thyromegaly. No carotid bruits.  CARDIOVASCULAR: Tachycardic. No murmurs, rubs, clicks or gallops.  LUNGS: Diminished breath sounds bilaterally without any rales, rhonchi or wheezing.  There is some accessory muscle usage.  ABDOMEN: Soft, nontender, nondistended. Positive bowel sounds x 4. There is no hepatosplenomegaly.  EXTREMITIES: No clubbing, cyanosis or edema.  SKIN: No rash.  LYMPHATICS: No lymph nodes palpable.  VASCULAR: Good DP, PT pulses.  PSYCHIATRIC: Not anxious or depressed.  NEUROLOGICAL: Awake, alert, oriented x 3. No focal deficits.   LABORATORY AND RADIOLOGICAL DATA: Shows no acute cardiopulmonary disease; COPD changes are present. CK-MB is 85, troponin less than 0.02. BMP: Glucose 112, BUN 10, creatinine 0.57, sodium 140, potassium 3.7, chloride 106, CO2 of 25, calcium 8.9. LFTs are normal except albumin of 3.0. WBC 9.6, hemoglobin 14.2, platelet count 206. ABG shows pH of 7.40, pCO2 of 41, pO2 of 109.   ASSESSMENT AND PLAN: The patient is a 80 year old African American female with history of chronic obstructive pulmonary disease, history of breast cancer, who presents with shortness of breath, cough for the past few days.  1.  Acute respiratory failure: Likely due to chronic obstructive pulmonary disease flare. With history of breast cancer, need to rule out pulmonary embolism. Will get a CT per pulmonary embolism protocol. Place her on IV Solu-Medrol, nebulizers and IV Levaquin for the time being. Will try to obtain sputum cultures as possible.  2.  Acute on chronic, chronic obstructive pulmonary disease exacerbation: Will treat her with nebulizers, IV steroids and antibiotics.  3.  Hypertension: Will continue amlodipine.  4.  History of breast cancer: Complains of breast enlargement, likely due to postradiation changes. Will ask oncology to see.  5.  Gastroesophageal reflux disease: Will place her on proton pump inhibitors.  6.  Miscellaneous: Will place her on Lovenox for deep vein thrombosis prophylaxis.   TIME SPENT: 35 minutes.   ____________________________ Lafonda Mosses Posey Pronto, MD shp:jm D: 09/21/2012 17:47:18 ET T: 09/21/2012 18:48:04  ET JOB#: [id number]  cc: Wynter Isaacs H. Posey Pronto, MD, <Dictator> Alric Seton MD ELECTRONICALLY SIGNED 09/25/2012 12:11

## 2014-11-24 NOTE — H&P (Signed)
PATIENT NAME:  Nicole Jones, Nicole Jones MR#:  [id number] DATE OF BIRTH:  09/20/1934  DATE OF ADMISSION:  10/14/2012  PRIMARY CARE PHYSICIAN:  Jaquelyn Bitter B. Brynda Greathouse, MD  REASON FOR ADMISSION:  Hypotension.   HISTORY OF PRESENT ILLNESS:  This is a 80 year old female with numerous recent hospitalizations was recently sent out of the hospital after being treated for respiratory failure and COPD exacerbation. The patient right now is not the greatest historian. History obtained from family at bedside and old chart. The patient does complain of shortness of breath and cough without production, some chills and some sweating. In the ER, blood pressure initially was found to be 82/52, pulse oximetry was 96% on 2 liters and pO2 on ABG was 50. White count was to be elevated at 14.7, platelet count at 109. BNP elevated at 9570. Creatinine elevated at 1.5. Hospitalist services were contacted for further evaluation.   PAST MEDICAL HISTORY:  Left breast cancer status post mastectomy, seizure disorder, COPD, CVA with no deficits, DVT in the past, history of hyperlipidemia, hypertension, tobacco abuse.   PAST SURGICAL HISTORY:  Aortoiliac bypass, trach placement and removal, hysterectomy, left mastectomy, right cataract.   ALLERGIES:  No known drug allergies.   MEDICATIONS:  As per prescription writer include DuoNeb nebulizer 3 mL every 2 hours as needed for shortness of breath, Xanax 0.25 mg every 8 hours as needed, anastrozole 1 mg daily, calcium/vitamin D 500 mg/200 International Units 1 tablet twice a day with meals, diltiazem 120 mg extended-release daily, Diovan 160 mg daily, Colace 100 mg twice a day, lovastatin 40 mg at bedtime, Reglan 10 mg twice a day, metoprolol 50 mg twice a day, Remeron 15 mg at bedtime, nicotine patch 21 mg per 24-hour chest wall film daily, omeprazole 20 mg daily, prednisone taper, propranolol 20 mg twice a day, Senna 1 tablet twice a day, Singulair 10 mg at bedtime, Spiriva 1 inhalation daily,  Symbicort 80/4.5 mcg 2 puffs twice a day, theophylline 80 mg/15 mL elixir 18.75 mL every 8 hours.   FAMILY HISTORY:  Unable to obtain. As per old chart, significant for hypertension.   SOCIAL HISTORY:  No smoking since discharge from the hospital. She has a nicotine patch on. No alcohol. No drug use. Currently at facility.   REVIEW OF SYSTEMS:  CONSTITUTIONAL: Positive for chills. Positive for fever. Positive for sweating. Positive for weight loss.  EYES: No visual issues.  EARS, NOSE, MOUTH, AND THROAT: No hearing loss, no sore throat. No difficulty swallowing.  CARDIOVASCULAR: No chest pain. No palpitations.  RESPIRATORY: Positive for shortness of breath. Positive for cough. No sputum. No hemoptysis.  GASTROINTESTINAL: No nausea. No vomiting. No abdominal pain. No diarrhea. No constipation. No bright red blood per rectum.  GENITOURINARY: No burning on urination, no hematuria.  MUSCULOSKELETAL: No joint pain or muscle pain.  INTEGUMENT: No rashes or eruptions.  NEUROLOGIC: No fainting or blackouts.  PSYCHIATRIC: No anxiety or depression.  ENDOCRINE: No thyroid problems.  HEMATOLOGIC/LYMPHATIC: No anemia. No easy bruising or bleeding.   PHYSICAL EXAMINATION: VITAL SIGNS: Temperature 97, pulse 85, respirations 22, blood pressure 82/52, pulse oximetry 96% on 2 liters.  GENERAL: Positive respiratory distress using accessory muscles, cachectic lady lying flat in bed.  EYES: Conjunctivae and lids normal. Pupils equal, round, and reactive to light. Unable to test extraocular muscles.  EARS, NOSE, MOUTH AND THROAT: Tympanic membrane blocked by wax. Nasal mucosa: No erythema. Throat: No erythema, no exudate seen. Lips and gums: No lesions.  NECK: No JVD. No  bruits. No lymphadenopathy. No thyromegaly. No thyroid nodules palpated.  RESPIRATORY: Decreased breath sounds bilaterally. Positive wheeze throughout entire lung field. Positive use of accessory muscles to breathe.  CARDIOVASCULAR: S1, S2  normal. No gallops, rubs or murmurs heard. Carotid upstroke 2+ bilaterally. No bruits. Dorsalis pedis pulses 2+ bilaterally. No edema of the lower extremity.  ABDOMEN: Soft, nontender. No organosplenomegaly. Normoactive bowel sounds. No masses felt.  LYMPHATIC: No lymph nodes in the neck.  MUSCULOSKELETAL: No clubbing, edema, no cyanosis on oxygen.  SKIN: No ulcers seen anteriorly.  NEUROLOGIC: The patient falls asleep very easily. Poor historian at this point. Deep tendon reflexes 1+ bilaterally. Follows commands and answers simple questions.  PSYCHIATRIC: The patient is oriented to person.   LABORATORY AND RADIOLOGICAL DATA:  EKG not seen on the chart. I will order one STAT. ABG shows a pH of 7.49, pCO2 of 33, pO2 of 50, O2 is 28% oxygen, bicarbonate 25.1, O2 saturation 88%. White blood cell count is 14.7, H and H 13.9 and 40.7, platelet count 109. Glucose is 71, BUN 28, creatinine 1.50, sodium 140, potassium 3.7, chloride 104, CO2 is 28, calcium 8.6. Liver function tests are total protein low at 5.5 and albumin low at 2.2. Troponin is negative. TSH is 3.79. BNP is elevated at 9570. Chest x-ray shows increased density in right lung, sequela of aspiration or lymphatic spread of malignancy. D-dimer is elevated at 1.39.   ASSESSMENT AND PLAN:   1.  Septic shock with pneumonia, hypotension and leukocytosis. We will admit to the Intensive Care Unit. The patient is currently on a dopamine drip. We will give intravenous fluid boluses to try to maintain blood pressure. Hold blood pressure medications. Since the patient was in the hospital numerous times, we need to get real aggressive with antibiotics of Zosyn, Zithromax and vancomycin started by me. Blood cultures ordered x 2. Overall prognosis is poor. We will get a palliative care consultation. The patient is a FULL CODE.  2.  Acute respiratory failure on oxygen supplementation to maintain pulse oximetry greater than 88%. Hopefully, the patient will not  need to be intubated.  3.  Chronic obstructive pulmonary disease exacerbation. We will give Solu-Medrol 60 mg intravenously q. 6 hours, DuoNeb nebulizer solution and continue aggressive antibiotics.  4.  Acute renal failure probably secondary to acute tubular necrosis with hypotension. Watch daily with intravenous fluid hydration.  5.  History of seizure, not on any medication for this.  6.  Hyperlipidemia. Hold cholesterol medication now.  7.  History of breast cancer on anastrozole, continue.  8.  Peripheral vascular disease.  9.  Tobacco abuse with a nicotine patch.  10.  Malnutrition with low albumin.  11.  The patient is a FULL CODE. Overall prognosis is poor.   TIME SPENT ON ADMISSION:  55 minutes of critical care time.    ____________________________ Tana Conch. Leslye Peer, MD rjw:si D: 10/14/2012 16:17:00 ET T: 10/14/2012 16:56:59 ET JOB#: [id number]  cc: Tana Conch. Leslye Peer, MD, <Dictator> Mikeal Hawthorne. Brynda Greathouse, MD   Marisue Brooklyn MD ELECTRONICALLY SIGNED 10/14/2012 19:12

## 2014-11-24 NOTE — Consult Note (Signed)
PATIENT NAME:  Nicole Jones, AREY MR#:  [id number] DATE OF BIRTH:  May 24, 1935  DATE OF CONSULTATION:  10/22/2012  REFERRING PHYSICIAN:   CONSULTING PHYSICIAN:  Jill Side, MD  REASON FOR CONSULTATION: PEG tube placement.   HISTORY OF PRESENT ILLNESS: A 80 year old female with multiple medical problems, including recent COPD exacerbation. The patient was readmitted on March 13th with hypotension and was found to be in septic shock. The patient developed respiratory failure, probably secondary to underlying COPD, and was intubated. The patient is currently being intubated and sedated. She recently underwent tracheostomy due to expectation for prolonged need for ventilatory support. GI was consulted yesterday to evaluate the patient for possible PEG placement. The patient was evaluated yesterday. No history is available from the patient. No family members are available, and information was obtained from the chart. She appeared comfortable.   PAST MEDICAL HISTORY: History of left breast cancer status post mastectomy, history of seizure disorder, COPD, CVA, history of DVT in the past, hyperlipidemia, hypertension and history of tobacco abuse.   PAST SURGICAL HISTORY: Aortoiliac bypass, history of prior trach placement and removal, mastectomy, hysterectomy and cataract surgery.   ALLERGIES: None.   MEDICINES AT HOME: She was on numerous medicines at home.  CURRENT MEDICINES IN THE HOSPITAL: Include propofol drip, fentanyl drip, Precedex drip, Tylenol, Xanax, methylprednisone, metoclopramide, Zofran, albuterol, lovastatin, propranolol, Diovan, hydralazine, insulin sliding scale and Protonix.   REVIEW OF SYSTEMS: Not available.   PHYSICAL EXAMINATION: GENERAL: Chronically ill-appearing female. She is deeply sedated. Does not appear to be in any acute distress.  VITAL SIGNS: Temperature 97.9, heart rate 77, respirations 12, blood pressure 150/50.  LUNGS EXAMINATION: Fairly unremarkable with fair  air entry bilaterally.  CARDIOVASCULAR EXAM: Distant heart sounds. Regular rate and rhythm.  ABDOMINAL EXAMINATION: Fairly normal-looking abdomen. Bowel sounds are positive. No hepatosplenomegaly or ascites was noted.  NEUROLOGICAL EXAMINATION: Cannot be assessed due to deep sedation.   LABS: Most recent white cell count is 6.7, hemoglobin 9.4, platelet count was low, but it is 105 after transfusion. Electrolytes are fairly unremarkable. BUN 32 and creatinine 0.72.   ASSESSMENT AND PLAN: Patient with respiratory failure, on ventilator. The patient recently underwent tracheostomy due to expectations of need for prolonged ventilatory support. GI has been consulted to evaluate the patient for feeding tube placement. The patient is currently being fed through an NG tube and seems to be tolerating it well. I agree it appears that the patient will require PEG placement for nutritional support. I will discuss it with the patient's family member, and if they agree, we will tentatively schedule her for early next week. The patient has multiple comorbid conditions, including thrombocytopenia, and her platelet count will be repeated. Further recommendations to follow.    ____________________________ Jill Side, MD si:lg D: 10/23/2012 10:45:35 ET T: 10/23/2012 13:55:23 ET JOB#: [id number]  cc: Jill Side, MD, <Dictator> Jill Side MD ELECTRONICALLY SIGNED 10/27/2012 11:10

## 2014-11-24 NOTE — Consult Note (Signed)
PATIENT NAME:  Nicole Jones, Nicole Jones MR#:  [id number] DATE OF BIRTH:  [birth year]  DATE OF CONSULTATION:  09/21/2012  REFERRING PHYSICIAN:  Shreyang H. Posey Pronto, MD CONSULTING PHYSICIAN:  Shawnte Demarest R. Ma Hillock, MD  REASON FOR CONSULTATION:  The patient with history of breast cancer   HISTORY OF PRESENT ILLNESS:  The patient is a 80 year old female with a past medical history significant for history of stage III breast cancer status post lumpectomy and node dissection in March 2013 (ER and PR positive, HER-2/neu negative by [facility]) and completed adjuvant chemotherapy with Taxotere and Cytoxan in July, following which she received radiation to the breast. She was then planned for followup and to consider adjuvant hormonal therapy, but this did not happen. The patient states that she did not receive an appointment at the [facility] for this. Currently, she has been admitted with complaints of progressive shortness of breath and felt to have acute respiratory failure due to COPD exacerbation. Clinically, she states that she is still weak, but overall slowly improving. CT scan of the chest done early yesterday is negative for pulmonary embolism, interstitial disease in the peripheral aspect of upper lobe likely secondary to infectious or inflammatory etiology, 2 lung nodules on the right side measuring 13 mm and 11 mm that are reportedly stable in size compared to prior scan. No other hilar or mediastinal adenopathy seen. The patient clinically states that appetite and weight are steady. No new bone pains. No history of osteoporosis.   PAST MEDICAL HISTORY AND PAST SURGICAL HISTORY:   1.  Breast cancer as described above.  2.  Coronary artery disease.  3.  GERD.  4.  COPD.  5.  Hyperlipidemia.  6.  Hypertension.  7.  Asthma.  8.  Aortoiliac bypass.  9.  Trach placement and removal in 2010.  10.  History of DVT.  11.  Status post total hysterectomy.  12.  Right cataract surgery.  13.  History of pneumonia.  14.   CVA/stroke in 2011.  15.  History of seizures in 2011.  16.  Depression.  17.  Left breast cancer in 2013 as described above.  18.  History of smoking.   FAMILY HISTORY:  Noncontributory.   SOCIAL HISTORY:  History of smoking. Denies alcohol or recreational drug usage.   HOME MEDICATIONS:  Albuterol/ipratropium nebulizer 2 to 3 times a day, amlodipine 10 mg daily, Diovan 160 mg daily, Klor-Con 20 mEq once daily, lovastatin 40 mg at bedtime, metoclopramide 10 mg b.i.d., mirtazapine 15 mg at bedtime, montelukast 10 mg daily, omeprazole 20 mg b.i.d., propranolol 20 mg b.i.d., Spiriva inhaler 18 mcg 1 capsule once daily, Symbicort inhalation b.i.d.   REVIEW OF SYSTEMS:  CONSTITUTIONAL: Generalized weakness, dyspnea on exertion. Denies fever or chills.  HEENT: Denies any headaches, dizziness at rest, epistaxis, ear or jaw pain.  CARDIAC: Denies any angina, palpitation, orthopnea or PND.  LUNGS: As in history of present illness. No hemoptysis. No chest pain.  GASTROINTESTINAL: No nausea, vomiting or diarrhea. No bright red blood in stools or melena.  GENITOURINARY: No dysuria or hematuria.  SKIN: No new rashes or pruritus.  MUSCULOSKELETAL: No new bone pains.  EXTREMITIES: Denies new swelling or pain.  NEUROLOGIC: No new focal weakness, seizures or loss of consciousness.  ENDOCRINE: No polyuria or polydipsia. Appetite is steady.   PHYSICAL EXAMINATION: GENERAL: The patient is a moderately built frail-looking individual, sitting in bed, otherwise alert and oriented and converses appropriately. No acute distress at rest. No icterus.  VITAL SIGNS:  Temperature 97.3, pulse 82, respiratory rate 20, blood pressure 118/74, 95% on room air.  HEENT: Normocephalic, atraumatic. Extraocular movements intact. Sclerae anicteric.  NECK: Negative for lymphadenopathy.  CARDIOVASCULAR: S1, S2, regular rate and rhythm.  LUNGS: Show bilateral diminished breath sounds overall. No crepitations, occasional rhonchi  present.  ABDOMEN: Soft, nontender. No hepatomegaly or masses.  EXTREMITIES: Trace edema. No cyanosis.  SKIN: No generalized rashes or bruising.  NEUROLOGIC: Limited exam. Cranial nerves are intact. Moves all extremities spontaneously.  MUSCULOSKELETAL: No obvious joint deformity or swelling.   DIAGNOSTIC DATA:  WBC 9600, hemoglobin 14.2, platelets 206, creatinine 0.57, calcium 8.9. Liver functions unremarkable except albumin 3.0. CT chest was done earlier today negative for PE, lung nodules present, but reportedly stable compared to prior scan. No adenopathy reported.   IMPRESSION AND RECOMMENDATIONS:   1.  Known history of stage III breast cancer status post surgery, chemotherapy and radiation therapy as described above. Tumor was positive for ER and PR. The patient however has not yet been started on hormonal therapy. She denies history of osteoporosis. I have discussed overall benefits and rationale of adjuvant hormonal therapy with aromatase inhibitor and also discussed side effect profile and she is agreeable to pursue this treatment. Will start on anastrozole 1 mg p.o. daily along with calcium and vitamin D twice daily for prevention of osteoporosis. It also appears that she has not had a mammogram in over a year now and will order one to get done tomorrow if possible. Will otherwise schedule her for followup with her primary oncologist, Dr. Oliva Bustard, in about 4 weeks for continued outpatient oncology followup of breast cancer and treatment planning.  2.  History of lung nodules. She had CT scan upon admission which is negative for pulmonary embolism and lung nodules that are reported as stable. This also will need continued monitoring as an outpatient when she follows up with Dr. Oliva Bustard.   I have also explained to her that a prescription for anastrozole has been sent to her pharmacy and that she needs to pick it up and continue upon discharge from hospitalization. The patient was explained the  above and she is agreeable to this plan.    Thank you for the referral. Please feel free to contact me if any additional questions.   ____________________________ Rhett Bannister Ma Hillock, MD srp:si D: 09/21/2012 17:20:00 ET T: 09/21/2012 18:23:42 ET JOB#: [id number]  cc: Halil Rentz R. Ma Hillock, MD, <Dictator> Alveta Heimlich MD ELECTRONICALLY SIGNED 09/21/2012 22:07

## 2014-11-24 NOTE — Discharge Summary (Signed)
PATIENT NAME:  Nicole Jones, Nicole Jones MR#:  [id number] DATE OF BIRTH:  [birth year]  DATE OF ADMISSION:  09/21/2012 DATE OF DISCHARGE:  09/23/2012  ADMITTING DIAGNOSIS:  Cough, shortness of breath.  DISCHARGE DIAGNOSES:   1.  Cough, shortness of breath due to acute on chronic, chronic obstructive pulmonary disease exacerbation.  2.  Acute respiratory failure. 3.  Likely community-acquired pneumonia based on the CT scan of her chest.  4.  History of breast cancer stage III status post evaluation by Dr. Ma Hillock.  He recommended patient be started on hormonal therapy so he started her on aromatase inhibitor and recommended follow-up with Dr. Oliva Bustard.  5.  History of seizure disorder.  6.  History of cerebrovascular accident with no residual deficits.  7.  History of pneumonia.  8.  History of deep vein thrombosis in the past.  9.  History of hyperlipidemia.  10.  Hypertension.  11.  Status post aortoiliac bypass.  12.  History of tracheostomy placement and removal in the past.  13.  Status post hysterectomy.  14.  Status post left mastectomy. 15.  Status post right cataract surgery.   PERTINENT LABORATORY AND EVALUATIONS:  Admitting chest x-ray showed COPD changes.  CK-MB was 85, troponin less than 0.02.  BMP, glucose was 112, BUN 10, creatinine 0.57, sodium 140.  Her LFTs were normal except albumin of 3.  WBC 9.6, hemoglobin 14.2.  CT scan of the chest per PE protocol showed no CT evidence of pulmonary embolus.  There is a reticular nodule interstitial disease in the peripheral aspect of the left upper lobe which may be secondary to infectious or inflammatory etiology.  There are 2 pulmonary nodules in the right lung which are 13 mm and 11 mm.  Blood culture showed no growth at 48 hours.    HOSPITAL COURSE:  Please refer to H and P done by me on admission.  The patient is a 80 year old African American female with chronic respiratory failure who has a history of COPD, but not on any oxygen at home,  presented.  She was short of breath for the past few days.  In the ED she was very tachypneic, received breathing treatments.  Despite that she did not improve.  Due to that, I was asked to admit the patient.  She was started on nebulizers and due to her history of breast cancer she had a CT per PE protocol which was negative for PE, but did show some possible inflammatory changes in the lung.  She was started on IV antibiotics.  The patient also complained of breast enlargement.  So, her oncologist was called.  They recommended outpatient mammogram and a follow-up.  At this time patient is doing much better and is stable for discharge.    DISCHARGE MEDICATIONS:  Propranolol 20 1 tab by mouth twice daily, omeprazole 20 1 tab by mouth twice daily, Symbicort 2 puffs twice daily, amlodipine 10 daily, Diovan 160 daily, Klor-Con 20 mEq daily, Reglan 10 1 tab by mouth twice daily, Spiriva 1 inhalation daily, Montelukast 10 daily, Albuterol, Atrovent nebs 2 to 3 times a day as previously, mirtazapine 15 at bedtime, Anastrozole 1 mg daily, calcium plus vitamin D 1 tab by mouth twice daily with meals, prednisone taper 60 mg, taper by 10 mg until complete, Levaquin 500 by mouth daily for 24 hours for the next 4 days.  HOME OXYGEN:  None.  DIET:  Low sodium.  ACTIVITY:  As tolerated.   FOLLOW-UP:  Follow with the  primary physician in 1 to 2 weeks.  Follow up with Dr. Oliva Bustard in 2 to 4 weeks for breast cancer follow-up.    TIME SPENT:  35 minutes.     ____________________________ Lafonda Mosses Posey Pronto, MD shp:ea D: 09/23/2012 17:36:16 ET T: 09/24/2012 06:10:50 ET JOB#: [id number]  cc: Takai Chiaramonte H. Posey Pronto, MD, <Dictator> Alric Seton MD ELECTRONICALLY SIGNED 09/25/2012 12:14

## 2014-11-24 NOTE — Consult Note (Signed)
PATIENT NAME:  Nicole Jones, WATTS MR#:  [id number] DATE OF BIRTH:  1934/10/28  DATE OF CONSULTATION:  10/17/2012  REFERRING PHYSICIAN:   Mariane Duval, MD  CONSULTING PHYSICIAN:  Jerene Bears, MD  CHIEF COMPLAINT: Respiratory failure.  HISTORY OF PRESENT ILLNESS: The patient is a 80 year old female with a history of multiple hospitalizations with respiratory failure and COPD exacerbation.  She previously has a history of breast cancer as well as lung cancer.  The patient was admitted and had worsening of her respiratory failure and was intubated. She has a history of tracheostomy tube placement as well in 2010 by Dr. Beverly Gust.  I was consulted for evaluation for tracheostomy tube placement given the patient's significant history of respiratory failure as well as history of tracheostomy in the past.  PAST MEDICAL HISTORY: Breast cancer, status post cystectomy, seizure disorder, COPD,  stroke, DVT, hyperlipidemia, hypertension, tobacco abuse.   PAST SURGICAL HISTORY:  Bypass, trach, hysterectomy, mastectomy, cataract.   ALLERGIES: No known drug allergies.   CURRENT MEDICATIONS: Duo-Neb, Xanax, vitamin D, diltiazem, Diovan, Colace, lovastatin, Reglan, metoprolol, Remeron, nicotine, omeprazole, prednisone, propranolol, Senna, Singulair, Spiriva, Symbicort, theophylline.   FAMILY HISTORY: Significant for hypertension, but unable to obtain secondary to the patient's intubation.   SOCIAL HISTORY: The patient has a significant history of tobacco abuse but reports, per computer, she had quit recently. There is no significant alcohol or drug use.   REVIEW OF SYSTEMS: Unable to be obtained given the patient's sedated and intubated state.  Review of the chart: Fever, chills, sweating, weight loss, shortness of breath, cough.     PHYSICAL EXAMINATION:   VITAL SIGNS: Temperature is 98.5, pulse 68, respirations 22, blood pressure 149/74, pulse oximetry 98% with intubation.  GENERAL: She is  intubated and sedated.  EARS: External EACs are clear.  NOSE: Clear anteriorly.  ORAL CAVITY AND OROPHARYNX: Reveals an ET tube in place and secure, patent.  NECK: A well-healed prior thyroidectomy site.  There is no significant scarring nor any significant innominate vessels.   LABORATORY:  CBC is reviewed which showed a white count 12.3, hemoglobin of 10, platelet count of 66.   IMPRESSION:  History of respiratory failure as well as thrombocytopenia.   PLAN: The patient is a tracheostomy tube candidate. We will see if we can do this on Monday, 10/18/2012. I placed an order in to please obtain a tracheostomy consent as there is no family members present. Please keep n.p.o. at midnight as well as hold any anticoagulation in the morning.  If the platelets continue to drop, we may need to either give a transfusion prior to the procedure or delay the procedure until they have stabilized.   ____________________________ Jerene Bears, MD ccv:cb D: 10/17/2012 12:30:18 ET T: 10/17/2012 18:05:47 ET JOB#: [id number]  cc: Jerene Bears, MD, <Dictator> Jerene Bears MD ELECTRONICALLY SIGNED 10/29/2012 17:50

## 2014-11-24 NOTE — Consult Note (Signed)
Chief Complaint:  Subjective/Chief Complaint PEG site OK. Bowel sounds normal. Abdomen is soft and benign.  May resume tube feeds this afternoon. Will sign off. Please call me if needed. Thanks.   Electronic Signatures: Jill Side (MD)  (Signed 25-Mar-14 11:47)  Authored: Chief Complaint   Last Updated: 25-Mar-14 11:47 by Jill Side (MD)

## 2014-11-24 NOTE — Discharge Summary (Signed)
PATIENT NAME:  Nicole Jones, Nicole Jones MR#:  [id number] DATE OF BIRTH:  07/19/1935  DATE OF ADMISSION:  09/30/2012 DATE OF DISCHARGE:  10/11/2012  ADDENDUM  PRIMARY CARE PHYSICIAN: [facility] COURSE: The patient remained somewhat hypotensive until the day of discharge with systolic blood pressure ranging from 90s to 120s and 140s. Her heart rate remained stable in the 70s to 80s. It was unclear why the patient would have hypotension when, in fact, she was running high blood pressure in the past.  We did a D-dimer, and her D-dimer was found to be elevated at 1.39 on 10/11/2012. It is recommended to get CT scan of chest done as outpatient and initiate anticoagulation therapy, if needed.  Meanwhile, the patient was relatively stable on the day of discharge with temperature 97.9, pulse was 86, respiratory rate was 18, blood pressure 114/63.  Saturation was 96% on room air at rest.   TIME SPENT:   40 minutes.   ____________________________ Theodoro Grist, MD rv:cb D: 10/11/2012 17:27:04 ET T: 10/12/2012 09:40:19 ET JOB#: [id number]  cc: Theodoro Grist, MD, <Dictator> [place] MD ELECTRONICALLY SIGNED 10/26/2012 15:13

## 2014-11-24 NOTE — Discharge Summary (Signed)
PATIENT NAME:  Nicole Jones, Nicole Jones MR#:  [id number] DATE OF BIRTH:  [birth year]  DATE OF ADMISSION:  10/14/2012 DATE OF DISCHARGE:  11/02/2012   ADMITTING PHYSICIAN:  Dr. Loletha Grayer.  DISCHARGING PHYSICIAN:  Dr. Gladstone Lighter.  PRIMARY CARE PHYSICIAN: Dr. Brynda Greathouse at [facility].   CONSULTATIONS IN THE HOSPITAL: 1.  Palliative care consultation by Dr. Izora Gala Phifer.  2.  Pulmonary critical care consultation by Dr. Mortimer Fries.  3.  ENT consultation by Dr. Carloyn Manner. 4.  Gastrointestinal consultation by Dr. Dionne Milo for PEG tube placement.   DISCHARGE DIAGNOSES:   1.  Septic shock.  2.  Acute on chronic respiratory failure.  3.  Pneumonia.  4.  Chronic obstructive pulmonary disease exacerbation.  5.  Thrombocytopenia.  6.  Hypertension.  7.  Insomnia.  8.  History of breast cancer.  9.  Hypernatremia and hyperkalemia while in the hospital.  10.  End-stage chronic obstructive pulmonary disease.   DISCHARGE HOME MEDICATIONS: 1.  Lovastatin 40 mg p.o. daily.  2.  Anastrozole 1 mg p.o. daily.  3,  Prednisone taper 30 mg p.o. daily for a day and taper off x 10 mg every day until stopped.  4.  Tylenol 650 mg q.4 hours p.r.n. for pain or fever.  5.  Trazodone 100 mg p.o. at bedtime.  6.  Lexapro 10 mg by mouth daily.  7.  Xanax 0.25 mg p.o. b.i.d.  8.  Xanax 0.25 mg q.8 hours as needed for anxiety.  9.  Protonix 40 mg p.o. daily.  10.  Multivitamin 5 daily.  11.  Jevity 1.5 calories bolus feeds, 237 mL via PEG tube 4 times a day at 8:00 a.m., noon, 4:00 p.m. and 8:00 p.m. with 50 mL free water flushes q.1 hours. 12.  The head of the bed must be elevated to at least 35 degrees during feeding and at least 45 minutes after each feed as aspiration precautions.  Hold tube feeds if residual is greater than 200 mL.  13.  Pro-Stat one packet supplement via nasogastric tube every day at 9:00 a.m. and 1:00 p.m. associated with 50 mL flushes before and after each feeding. 14.  The  patient is on trach mask currently on room air, awaiting speaking valve placement  DISCHARGE DIET: As mentioned above PEG tube feeds.  FOLLOWUP INSTRUCTIONS: 1.  PCP follow-up in 1 to 2 weeks.  2.  Physical therapy as tolerated.  3.  ENT follow-up in 1 week for decannulation.  LATEST LABORATORY AND DIAGNOSTIC DATA:  PH is 7.48, pCO2 of 38, pO2 of 86, bicarb 28 and sats 99% on 28% FiO2. Sodium 130, potassium 3.6, chloride 95, bicarb 33, BUN 24, creatinine 0.47, glucose 133 and calcium of 7.6. WBC 9.7, hemoglobin 11.3, hematocrit 30.7, platelet count is 146.  Stool for C. difficile is negative in 10/26/2012.   BRIEF HOSPITAL COURSE:  For more details, please look at the history and physical done by Dr. Loletha Grayer on admission. Also look at interim discharge summary done by Dr.  Abel Presto on 10/21/2012.  The patient is a 80 year old African American female with past medical history significant for COPD, prior history of breast cancer, hypertension and hyperlipidemia who presented to the hospital on 10/14/2012 secondary to hypotension and was noted to be in septic shock.   1.  Acute on chronic respiratory failure. The patient was intubated and has remained on the vent and had tracheostomy done by ENT on 10/20/2012. She was treated with IV steroids and also  antibiotics. The reason f0or her respiratory failure was deemed secondary to be COPD exacerbation and also healthcare acquired pneumonia. She was on broad-spectrum antibiotics, which she finished the course.  Sputum cultures and blood cultures have remained negative and she is on the vent protocol with inhalers.  She was on trach collar trials for more than 48 hours and was weaned to 28% FiO2. However, over the last 12 hours, the patient has been saturating well on room air. Her trach has been changed to #4 cuffless Shiley and she is currently awaiting a speech consult to see if she can get a speaking valve likely outpatient follow-up with ENT  for possible decannulation next week.  She is on a steroid taper and will finish off her prednisone in the next 3 days. 2. Septic shock secondary to healthcare acquired pneumonia as mentioned above. Blood pressures are improving off of any pressors and finished her antibiotics.  3.  Hypertension. The patient has remained hypertensive while in the hospital; however, over the last couple of days, blood pressures are much improved so she is currently being discharged only on propranolol at 20 mg 3 times a day.  4.  Insomnia, anxiety. She is started on trazodone while in the hospital.  She is also on Lexapro and Xanax.  5.  History of breast cancer.  The patient is on maintenance anastrozole hormonal therapy.   6.  Nutrition has a PEG tube placed and she is on Jevity bolus feeds 4 times a day with processed  supplements twice a day. Currently will get a speaking valve and hopefully once the trach is decannulated, speech consult can be done again for oral foods..  7.  Her CODE STATUS has remained full code.   CONDITION ON DISCHARGE:  Stable.  DISCHARGE DISPOSITION: [place] nursing facility.   Time spent on discharge: 45 minutes.     ____________________________ Gladstone Lighter, MD rk:ct D: 11/02/2012 08:57:28 ET T: 11/02/2012 09:29:50 ET JOB#: [id number]  cc: Gladstone Lighter, MD, <Dictator> Mikeal Hawthorne. Brynda Greathouse, MD Gladstone Lighter MD ELECTRONICALLY SIGNED 11/19/2012 15:40

## 2014-11-24 NOTE — Discharge Summary (Signed)
PATIENT NAME:  Nicole Jones, GOESER MR#:  [id number] DATE OF BIRTH:  1935/03/17  DATE OF ADMISSION:  09/30/2012 DATE OF DISCHARGE:  10/11/2012  Please refer to the interim discharge summary dictated by Dr. Dustin Flock on 10/08/2012.   DISCHARGE DIAGNOSES: 1.  Acute on chronic hypercarbic hypoxic respiratory failure.  2.  Chronic obstructive pulmonary disease exacerbation.  3.  Acute bronchitis. 4.  Ongoing tobacco abuse.  5.  Hypertension, resolved. 6.  History of hypertension.  7.  Hyperlipidemia.  8.  Stroke.  9.  Deep venous thrombosis.  10.  Breast cancer.  11.  Seizure disorder.    DISCHARGE CONDITION: Fair.   DISCHARGE MEDICATIONS: The patient is to continue propranolol 10 mg p.o. twice daily, omeprazole 10 mg p.o. twice daily, Symbicort 80/4.5, 2 puffs twice daily, Diovan 160 mg p.o. daily, metoclopramide 10 mg p.o. twice daily, Spiriva 1 inhalation once daily, Montelukast  10 mg p.o. once daily, lovastatin 40 mg p.o. at bedtime, Remeron 15 mg p.o. at bedtime, anastrozole 1 mg p.o. daily, calcium with vitamin D 500/200, 1 tablet twice daily, ProAir HFA  1 puff every 4 hours as needed, Albuterol 2.5 mg every 2 hours as needed, prednisone 50 mg on 10/12/2012, then taper by 10 mg until stopped, alprazolam 0.25 mg every 8 hours as needed, theophylline 80 mg in 15 mL of elixir, take 100 mg every 8 hours, albuterol/ipratropium nebulizers 2.5/0.5 mg in 3 mL inhalation solution, 1 inhalation 4 times daily, diltiazem CD  120 mg p.o. once daily, nicotine 21 mg transdermal patch daily, Senna 1 tablet twice daily, and docusate sodium 100 mg p.o. twice daily.    Apparently, she was on propranolol at home, as well as Diovan, as well as Cardizem CD, so all of those medications need to be held if her blood pressure is below 100.  Continue diet: Low sodium, low fat, low cholesterol, mechanical soft.   Activity limitations: As tolerated. She is to follow up with physical therapy  2-7  times a week.    FOLLOWUP APPOINTMENT: [facility] in 2 days after discharge.   CONSULTANTS: Pulmonologist Dr. Devona Konig, Dr. Peter Congo,  Palliative Care.   RADIOLOGIC STUDIES: Chest, portable single view 10/05/2012 showed no acute cardiopulmonary disease. Repeat chest x-ray, portable single view, 10/06/2012 showed COPD, no acute cardiopulmonary disease. Portacath device was present. Repeated chest x-ray, portable single view, 10/07/2012 showed no acute disease of the chest.   HOSPITAL COURSE: The patient is a 80 year old African American female with a history of ongoing tobacco abuse, COPD, who presented to the hospital with complaints of shortness of breath. Please refer to the Dr. Graciela Husbands admission note on the 09/30/2012. She was recently discharged from [facility] for COPD exacerbation and came back with shortness of breath, wheezing, as well as productive sputum. She was noted to be hypoxic as well as hypercarbic. CO2 went to 54, and she had respiratory acidosis. She was placed on BiPAP, started on antibiotic therapy as well as inhalation therapy and steroids and admitted to the hospital. She failed the BiPAP and she was intubated just for a few days. She was extubated, and post-extubation she did quite well. Over the next few days ago, from 10/08/2012 she was progressively getting better, her oxygenation remained stable, and she was able to communicate with no significant shortness of breath or desaturations. Her oxygen sats are 96% to 97% on room air at rest on 10/11/2012. She was evaluated for possible need for oxygen at home; however, she  failed again to qualify for oxygen. The patient is to continue antibiotic as well as steroid taper, and she is to continue inhalation therapy. She was evaluated by a physical therapist, who recommended rehabilitation placement, where she will be discharged today on 10/11/2012. She was not noted to have any pneumonia; however, COPD exacerbation was very  likely related to bronchitis, questionable bacterial versus viral.   For tobacco abuse, she was counseled. She is to continue nicotine patch; however, she refused to have a nicotine inhaler.   The patient was noted to be hypotensive. She was given some IV fluids for hypotension. Her blood pressure remains somewhat fluctuating from 24M to 628 systolic on the day of discharge. It is recommended to follow her blood pressure readings and make decisions about holding her blood pressure medications depending on her needs. We did not start her on any blood pressure medications while she was in the hospital, and we recommended to be very careful with propranolol as well as Diovan, which she took at home. We think that she may have those medications suspended for a little while until she is fully rehydrated.   Regarding chronic medoical issues, such as hyperlipidemia, stroke and DVT: The patient is to continue her outpatient management.   The patient is being discharged to a skilled nursing facility with the above-mentioned medications and followup. In regards to oxygenation, her oxygen level is quite good on room air. For diet issues, she is to continue a low-fat, low-cholesterol diet for history of hyperlipidemia as well as hypertension. Due to her generalized weakness, she is to continue physical therapy in the facility. She is to follow up with [facility] in the next few days after discharge to make decisions about her ongoing restorative therapy.   TIME SPENT: 40 minutes.     ____________________________ Theodoro Grist, MD rv:dm D: 10/11/2012 11:00:55 ET T: 10/11/2012 12:53:13 ET JOB#: [id number]  cc: Theodoro Grist, MD, <Dictator> [place] MD ELECTRONICALLY SIGNED 11/04/2012 13:37

## 2014-11-24 NOTE — Consult Note (Signed)
Chief Complaint:  Subjective/Chief Complaint PEG placed successfully. No immediate complications. Will follow.   Electronic Signatures: Jill Side (MD)  (Signed 24-Mar-14 18:31)  Authored: Chief Complaint   Last Updated: 24-Mar-14 18:31 by Jill Side (MD)

## 2014-11-24 NOTE — Consult Note (Signed)
Brief Consult Note: Diagnosis: respiratory failure.   Patient was seen by consultant.   Consult note dictated.   Recommend to proceed with surgery or procedure.   Orders entered.   Discussed with Attending MD.   Comments: 80 y.o. female with history of COPD and respiratory failure h/o previous trach in 2010 intubated again.  Asked to evaluate for tracheostomy due to recurrent episodes of respiratory failure.  PE- GEN- intubate and sedated Neck- well healed thyroidectomy incision site.  no high-riding vessels  Impression:  80 y.o. female with h/o respiratory failure  Plan: 1)  Trach candidate given history 2)  Possibility of adding on tomorrow at noon.  Please make NPO at midnight and hold anticoagulation in a.m. 3)  No family present.  Order placed to obtain consent for tracheostomy tube. 4)  Thrombocytopenia- will follow, if continues to decrease will need transfusion prior to surgery or delay until stabilized as will increase the post-operative bleeding greatly.  Electronic Signatures: Sailor Haughn, Shela Leff (MD)  (Signed 16-Mar-14 12:23)  Authored: Brief Consult Note   Last Updated: 16-Mar-14 12:23 by Pascal Lux (MD)

## 2014-11-24 NOTE — H&P (Signed)
PATIENT NAME:  Nicole Jones, Nicole Jones MR#:  [id number] DATE OF BIRTH:  03/31/1935  DATE OF ADMISSION:  09/30/2012  REFERRING PHYSICIAN:  Dr. Marjean Donna.  PRIMARY CARE PHYSICIAN:  Used to follow with [facility], but not recently due to financial issues.   CHIEF COMPLAINT:  Shortness of breath.   HISTORY OF PRESENT ILLNESS:  This is a 80 year old female with significant past medical history of COPD, not on any home oxygen, who was recently discharged from [facility] for COPD exacerbation and acute respiratory failure.  The patient was discharged before seven days, since then she presented  1 time to ED for shortness of breath, she presents again today with complaints of shortness of breath, wheezing and productive sputum, the patient was discharged on tapering dose prednisone and by mouth Levaquin which she did not finish yet, the patient was saturating in the low 90s on room air, but she was noticed to be hypercarbic on ABG, where her pCO2 upon last discharge was 41, and today it was 54 with mild respiratory acidosis of pH 7.35, the patient received multiple nebulizer treatments in the ED, and received two doses of 125 mg of IV Solu-Medrol in ED without much improvement of her wheezing and shortness of breath, so hospitalist service were requested to admit the patient for further management of her acute respiratory failure and COPD exacerbation, the patient had CT chest angiogram in the ED to rule out PE as she was hypoxic, tachycardic, which came negative for PE, but it did show irregular lung nodules which are stable from a CT chest she got last admission, and it did show some questionable mucus plugging in the right lower lung and resolving pneumonia in the left upper lobe.   PAST MEDICAL HISTORY: 1.  History of left breast cancer status post left mastectomy, the patient started to follow with oncology service, has an appointment scheduled on the 9th outpatient with Dr. Oliva Bustard.   2.  History of seizure disorder.  3.  History of COPD.  4.  History of CVA with no residual effects.  5.  History of pneumonia.  6.  History of DVT in the past.  7.  History of hyperlipidemia.  8.  Hypertension.   PAST SURGICAL HISTORY: 1.  Status post aortoiliac bypass.  2.  History of trach placement and removal.  3.  Status post hysterectomy.  4.  Status post left mastectomy.  5.  Status post right cataract surgery.   ALLERGIES:  None.   HOME MEDICATIONS: 1.  Diovan 160 mg oral daily.  2.  Propranolol 20 mg oral 2 times a day.  3.  Mirtazapine 15 mg oral daily.  4.  Metoclopramide 10 mg oral 2 times a day.  5.  Lovastatin 40 mg oral at bedtime.  6.  Anastrozole 1 mg oral daily.  7.  ProAir inhaler as needed.  8.  Spiriva 18 mcg inhalation daily.  9.  Symbicort 80/4.5 2 puffs 2 times a day.  10.  Norvasc 10 mg oral daily. 12.  Omeprazole 20 mg oral daily.  13.  Calcium with vitamin D 1 tablet 2 times a day.  14.  Prednisone tapering dose.   SOCIAL HISTORY:  Continues to smoke 1 pack per day.  No alcohol or drug use.   FAMILY HISTORY:  Significant for hypertension.   REVIEW OF SYSTEMS: CONSTITUTIONAL:  The patient complains of generalized weakness and fatigue.  No significant weight loss or weight gain.  Denies any fever  or chills.  HEENT:  Has history of cataracts.  Denies any double vision, blurry vision.  No drainage from her eye.  No difficulty with vision.  EARS, NOSE, THROAT:  Denies any ringing in the ear.  No difficulty of hearing.  Denies any nasal drainage.  Denies any epistaxis.  No difficulty swallowing.  CARDIOVASCULAR:  No chest pain, no palpitation, no orthopnea, no syncope, no arrhythmias.  PULMONARY:  Has complaints of cough, shortness of breath, productive sputum, green in color.  No hemoptysis.  Has history of COPD.  GASTROINTESTINAL:  Denies nausea, vomiting, diarrhea, abdominal pain, hematemesis, hematochezia.  GENITOURINARY:  Denies any frequency,  urgency, hesitancy.  ENDOCRINE:  Denies polyuria, polydipsia, heat or cold intolerance or diabetes.  SKIN:  Denies any rash or skin lesions.   LYMPHATIC:  Denies any lymph node enlargement.  VASCULAR:  Denies any claudication symptoms.  NEUROLOGIC:  Has history of previous CVA in the past.  Denies any ataxia, any focal deficits, tingling or numbness.  PSYCHIATRIC:  Denies any anxiety, any depression, any bipolar disorder, substance or alcohol abuse.   PHYSICAL EXAMINATION: VITAL SIGNS:  Temperature 98, pulse 97, respiratory rate 22, blood pressure 133/92, saturating 97% on oxygen.  GENERAL:  Frail, elderly female looks comfortable in bed, in no apparent distress.  HEENT:  Head atraumatic, normocephalic.  Pupils equal, reactive to light.  Pink conjunctivae.  Anicteric sclerae.  Moist oral mucosa.  NECK:  Supple.  No thyromegaly.  No JVD.   CHEST:  Fair air entry bilaterally with diffuse wheezing and scattered rhonchi with decreased air entry in the right lower lung and rales.  CARDIOVASCULAR:  S1, S2 heard.  No rubs, murmurs, gallops.  ABDOMEN:  Soft, nontender, nondistended.  Bowel sounds present.  EXTREMITIES:  No edema.  No clubbing.  No cyanosis.  PSYCHIATRIC:  Appropriate affect.  Awake, alert x 3.  Intact judgment and insight.  NEUROLOGIC:  Cranial nerves grossly intact.  Motor 5 out of 5.  No focal deficits.  PSYCHIATRIC:  Appropriate affect.  Awake, alert x 3.  Intact judgment and insight.  SKIN:  Normal skin turgor.  Warm and dry.  No rash.   PERTINENT LABORATORY DATA:  Glucose 92, BUN 18, creatinine 0.83, sodium 143, potassium 5.1, chloride 108, CO2 30.  Troponin less than 0.02.  White blood cell 8.7, hemoglobin 14, hematocrit 43.8, platelets 289.  ABG showing pH of 7.35, pCO2 54, pO2 of 90.  Chest x-ray showing emphysematous lung changes without any acute cardiopulmonary disease.  CT chest with contrast showing emphysematous changes with right lower lobe bronchi narrowing, possibly  due to mucus plugging and reticulonodular interstitial thickening or peripheral left upper lobe suggesting resolving infectious or inflammatory process and no PE and no large filling defects in central pulmonary arteries to suggest large pulmonary embolism and stable appearance of 2 irregular nodes in the right lung.   ASSESSMENT AND PLAN: 1.  Acute hypercarbic respiratory failure.  The patient having mild respiratory acidosis with pH of 7.35, with pCO2 elevated from 41 on discharge to 54, this is related mainly due to her chronic obstructive pulmonary disease exacerbation, and to a lesser degree possibly due to mucus plugging of the right lower lung.  2.  Chronic obstructive pulmonary disease exacerbation, we will continue patient on her home medication Singulair, Spiriva and Symbicort, we will add IV Solu-Medrol and nebulizer treatment.  As well, we will have her on oxygen to keep her O2 sat above 95% and given the fact she is  having greenish productive sputum we will start her on Levaquin.  3.  Lung nodules on the CT chest even though it appears to be stable, but this is from a CT scan done last week, we will consult pulmonary service to evaluate especially with her history of smoking as well with her known history of breast cancer.  4.  History of breast cancer, which is following with oncology as an outpatient, we will continue her on anastrozole.  5.  Tobacco abuse.  The patient was counseled at length, expresses to quit smoking, she will be started on NicoDerm patch.  6.  Hypertension.  Continue with Norvasc.  7.  Gastroesophageal reflux disease.  We will continue with proton pump inhibitor, especially she is on large dose steroids.  8.  Deep vein thrombosis prophylaxis.  Subcutaneous heparin.   CODE STATUS:  FULL CODE.   TOTAL TIME SPENT ON ADMISSION AND PATIENT CARE:  55 minutes.    ____________________________ Albertine Patricia, MD dse:ea D: 09/30/2012 06:23:30 ET T: 09/30/2012  06:52:15 ET JOB#: [id number]  cc: Albertine Patricia, MD, <Dictator> Kyri Shader Graciela Husbands MD ELECTRONICALLY SIGNED 10/04/2012 1:28

## 2014-11-26 NOTE — Discharge Summary (Signed)
PATIENT NAME:  Nicole Jones, Nicole Jones MR#:  [id number] DATE OF BIRTH:  [birth year]  DATE OF ADMISSION:  02/01/2012 DATE OF DISCHARGE:  02/02/2012  The patient left AGAINST MEDICAL ADVICE on 02/02/2012.    DISCHARGE DIAGNOSES:  1. Possible seizure with pending work-up. The patient left AGAINST MEDICAL ADVICE.  2. Acute on chronic respiratory failure likely due to chronic obstructive pulmonary disease exacerbation.  3. Hypertension.  4. Slightly related troponin, likely demand ischemia.   SECONDARY DIAGNOSES:  1. Hypertension.  2. Chronic obstructive pulmonary disease, end-stage.  3. Hypertension.  4. Hyperlipidemia.  5. Anxiety.  6. Peripheral vascular disease. 7. Gastroesophageal reflux disease. 8. Breast cancer.   CONSULTATIONS:  1. Pulmonary, Dr. Devona Konig  2. Occupational therapy  3. Neurology was also consulted. The patient did not stay until she could be seen.   PROCEDURES/RADIOLOGY:  1. MRI of the brain on July 1st showed no acute intracranial abnormality.  2. 2-D echocardiogram on June 30th showed mild diastolic dysfunction. Normal LV systolic function. EF more than 55%.  3. CT scan of the head without contrast on June 30th showed no acute intracranial process. Chronic small vessel ischemic disease.  4. Chest x-ray on June 30th showed no acute pulmonary disease. Small nodule in the right middle lobe as seen before.   HISTORY AND SHORT HOSPITAL COURSE: The patient is a 80 year old female with the above-mentioned medical problems who was admitted for acute on chronic respiratory failure secondary to COPD exacerbation. Pulmonary consultation was obtained with Dr. Devona Konig who agreed with management with steroids, nebulizer, and theophylline. The patient was also found to have possible seizure while in the Emergency Department for which Neurology consultation was obtained but the patient did not want to stay in the hospital and left [facility] before she could be  evaluated. She had borderline elevated cardiac enzymes which was thought to be due to supply/demand ischemia from underlying pulmonary disease. She also had significant leukocytosis which was being monitored pending complete work-up but the patient would not stay in the hospital. Her white count jumped from 1.7 to 45.0 and on July 1st it was 102.7 and she was asked to stay in the hospital for further work-up including possible Hematology evaluation but she would not be willing to stay in the hospital. She also underwent EEG, results of which are pending. Her vitals have remained stable.   The patient was competent, although at times she was confused but was able to make decisions for herself and was very clear that she would not stay in the hospital as she feels fine and left AGAINST MEDICAL ADVICE.   ____________________________ Lucina Mellow. Manuella Ghazi, MD vss:drc D: 02/03/2012 07:56:20 ET T: 02/03/2012 12:26:28 ET JOB#: [id number]  cc: Tahlor Berenguer S. Manuella Ghazi, MD, <Dictator> Allyne Gee, MD Rudell Cobb. Loletta Specter, [place] MD ELECTRONICALLY SIGNED 02/04/2012 8:57

## 2014-11-26 NOTE — Consult Note (Signed)
PATIENT NAME:  Nicole Jones, Nicole Jones MR#:  [id number] DATE OF BIRTH:  11-04-1934  DATE OF CONSULTATION:  02/01/2012  REFERRING PHYSICIAN:   CONSULTING PHYSICIAN:  Allyne Gee, MD  REASON FOR CONSULTATION:  Acute respiratory failure.   HISTORY OF PRESENT ILLNESS: This is a 80 year old female known to me from the office. She has a past medical history significant for chronic obstructive pulmonary disease. She states that she has been sick for about a week prior to coming into the hospital. She said that it started precipitated with a cough and congestion. She had been seen in the office and had been given Levaquin and also some steroids. She felt that she really was not improving so she came into the hospital. The patient had also been in the Emergency Room on 06/23 with the same complaints. She also, of note, has had recent chemotherapy.   PAST MEDICAL HISTORY:  1. Chronic obstructive pulmonary disease.  2. Hypertension.  3. Hyperlipidemia.  4. Anxiety disorder.  5. Breast cancer.  6. Gastroesophageal reflux.   PAST SURGICAL HISTORY:  1. Port-A-Cath placement. 2. Aortofemoral bypass. 3. Hysterectomy.   SOCIAL HISTORY: Positive for tobacco use and she still smokes.   ALLERGIES: No known drug allergies.   FAMILY HISTORY: Positive for pancreatic cancer.   MEDICATIONS: Medications are reviewed on the electronic medical record.   REVIEW OF SYSTEMS: GENERAL: She had been having some fatigue and weakness. HEENT: Denies any diplopia. No tinnitus. No nosebleeds. CARDIOVASCULAR: Negative for chest pain or palpitations. She did have dyspnea on exertion. RESPIRATORY: Positive for shortness of breath and cough. GU: Negative for any hematuria. GASTROINTESTINAL: Negative for any nausea or vomiting. SKIN: No acute rashes. NEUROLOGIC: She has a history of transient ischemic attacks and tremors in the past. PSYCHIATRIC: Negative for any anxiety or depression. ENDOCRINE: Negative for polyuria or polydipsia.  The remainder of review of systems performed was unremarkable.   PHYSICAL EXAMINATION:  GENERAL: At the time she was evaluated, her temperature was 97.2, pulse 88, respiratory rate 18, blood pressure 94/56, saturations 94%.   NECK: The patient's neck appeared to be supple. There was no JVD. No adenopathy. No thyromegaly.   CHEST: Some rhonchi present in both lung fields.   CARDIOVASCULAR: S1, S2 normal. Regular rhythm. No gallop or rub.   ABDOMEN: Soft and nontender.   EXTREMITIES: Without cyanosis or clubbing. Pulses are equal.   NEUROLOGIC: She was awake and alert, moving all four extremities.   SKIN: Without any acute rashes.   MUSCULOSKELETAL: Without any pain or arthritis.   LABORATORY DATA: Labs were reviewed on the electronic chart.   IMPRESSION: Acute respiratory failure secondary to chronic obstructive pulmonary disease exacerbation with ongoing tobacco use.   PLAN: She is on maximal therapy right now. Would continue with steroids as you are, continue with antibiotics as you are. In addition, she is on theophylline, which should be continued. As an outpatient, will consider starting her on Daliresp if she is able to tolerate it. Radiological studies, etc. were reviewed and on the chest film she had hyperinflation, but there was no pneumonia noted, so this likely represents an acute exacerbation of her chronic obstructive pulmonary disease.   Thank you for consulting me in the care of this patient. Will follow.    ____________________________ Allyne Gee, MD sak:ap D: 02/01/2012 13:09:43 ET T: 02/01/2012 13:29:37 ET JOB#: [id number]  cc: Allyne Gee, MD, <Dictator>  Allyne Gee MD ELECTRONICALLY SIGNED 02/20/2012 13:14

## 2014-11-26 NOTE — Op Note (Signed)
PATIENT NAME:  Nicole Jones, Nicole Jones MR#:  [id number] DATE OF BIRTH:  1934/09/07  DATE OF PROCEDURE:  10/28/2011  PREOPERATIVE DIAGNOSIS: Left breast cancer.   POSTOPERATIVE DIAGNOSIS: Left breast cancer.   OPERATIVE PROCEDURE: Wide local excision with mastoplasty, axillary dissection.   SURGEON: Hervey Ard, MD   ANESTHESIA: General by LMA, Marcaine 0.5% with 1:200,000 units epinephrine 20 mL local infiltration.   CLINICAL NOTE: This 80 year old woman was noted to have an abnormal mammogram with a mass in the upper outer quadrant of the left breast. Core biopsy showed evidence of infiltrating cancer. Given her options for management, she was felt to be a candidate for breast conservation. She underwent preoperative MRI because of an infiltrating lobular pattern on her biopsy. This showed the contralateral breast to be unremarkable, but at least two enlarged lymph nodes in the left axilla suspicious for metastatic disease. For that reason, it was elected not to complete a sentinel node biopsy, but to proceed directly to axillary dissection.   DESCRIPTION OF PROCEDURE: With the patient under adequate general anesthesia, the area of the breast, chest, and axilla was prepped with ChloraPrep and draped. She received Kefzol prior to the procedure. Ultrasound was used to identify the previous biopsy site, in the upper outer quadrant of the left breast. This was marked out and a transverse skin line incision made over this area. The skin was incised sharply and the remaining dissection completed with electrocautery. The specimen was orientated and specimen radiograph completed showing the previously placed clip within the specimen. Frozen section report from Quay Burow MD from the permanent pathology suggested that the cranial and anterior lateral margins were close if not positive. With this information, an ellipse of skin from the superior aspect of the incision was removed as well as an additional 1 cm  breast parenchyma. The specimen was orientated, tagged to a Telfa pad and placed in formalin for pathologic review. All the original frozen section was pending. The axilla was opened through a transverse incision, at the lower aspect of the axilla. Skin was incised sharply and the dissection then completed with electrocautery and the Harmonic scalpel. The margins of dissection were the axillary vein superiorly, the chest wall medially, the latissimus muscle laterally, and the axillary tail superiorly. Hemostasis was good. The long thoracic nerve of Bell and thoracodorsal nerve, artery, and vein bundle were protected during the dissection and both nerves worked appropriate at the end of the procedure. A small Blake drain was placed in the axilla and brought out through a separate stab wound and anchored in place with 3-0 nylon.   Mastoplasty was completed to close the generous breast defect. The breast was elevated off the underlying pectoralis muscle and the fascia approximated with interrupted 2-0 Vicryl figure-of-eight sutures. Multiple layers were used to approximate the breast parenchyma. Flaps of skin and adipose tissue were raised particularly inferiorly to allow smooth approximation of the superficial tissues. The skin was closed with a running 4-0 Vicryl subcuticular suture. The axilla was closed in layers with interrupted 2-0 Vicryl suture and then a running 4-0 Vicryl subcuticular suture for the skin. Benzoin and Steri-Strips were applied. Telfa pads were placed. A fluff gauze dressing was made followed by Kerlix and an Ace wrap.   The patient tolerated the procedure well. She was noted to be moderately hypotensive on presentation to the hospital today and this persisted through the operative procedure without evidence of ill effect.  ____________________________ Robert Bellow, MD jwb:slb D: 10/28/2011 20:41:12 ET  T: 10/29/2011 08:40:58 ET JOB#: [id number]  cc: Robert Bellow, MD,  <Dictator> Melinda Crutch. Candiss Norse, MD Allyne Gee, MD Dorian Duval Amedeo Kinsman MD ELECTRONICALLY SIGNED 11/03/2011 12:01

## 2014-11-26 NOTE — H&P (Signed)
PATIENT NAME:  Nicole Jones, Nicole Jones MR#:  [id number] DATE OF BIRTH:  07/07/1935  DATE OF ADMISSION:  02/01/2012  PRIMARY CARE PHYSICIAN: Dr. Devona Konig. ER PHYSICIAN: Dr. Robb Matar.   CHIEF COMPLAINT: Trouble breathing and also headache.   HISTORY OF PRESENT ILLNESS: The patient is a 80 year old female with history of chronic obstructive pulmonary disease, hypertension, breast cancer, who came in because of trouble breathing and cough going on for almost 1 to 2 weeks. The patient was given prednisone and Levaquin given by Dr. Devona Konig because of no relief with persistent trouble breathing and cough. The patient came to the Emergency Room. The patient also complained of severe headache which happened this morning. The patient did not have vomiting, no blurred vision. No loss of consciousness and no neck pain. The patient's headache was mainly on the top of the head. The patient's headache was worse with light, did not have any fever or chills, but trouble breathing is worse for the last one week. The patient was in the ER on 06/23 because of trouble breathing and she actually had chemotherapy a week ago. Because of persistent trouble breathing, she came in. The patient did receive three nebulizer treatments in the ER. The patient was noted to have an episode of seizure while she was sitting in the wheelchair and that happened around 7:00 this morning. She received a dose of Ativan 1 milligram. The patient's family reports she has episodes of shaking. She had a history of transient ischemic attack before. After that, she got these episodes, but not on any seizure medications. The patient did not have any loss of consciousness with this episode and did not have incontinence. The patient noted to have some stiffness and some spastic extremities and movements. The patient did receive Ativan for possible seizure and we are asked to admit the patient for headache, seizures, chronic obstructive pulmonary disease  exacerbation with elevated white count. The patient right now is receiving breathing treatments and also slightly sedated because of Ativan and unable to give me any complaints. History is obtained from patient's niece at bedside.   PAST MEDICAL HISTORY:   1. History of hypertension. 2. Chronic obstructive pulmonary disease, end-stage. 3. Hypertension.  4. Hyperlipidemia.  5. Anxiety. 6. PVD. 7. Gastroesophageal reflux disease. 8. Breast cancer.   PAST SURGICAL HISTORY:  1. Hysterectomy.  2. Aortofemoral bypass. 3. Port-A-Cath placement.   ALLERGIES: No known allergies.   SOCIAL HISTORY: The patient still smokes about a pack per day. No alcohol. No drugs.   FAMILY HISTORY: Significant for sister had pancreatic cancer. The patient's father also had some type of cancer.   MEDICATIONS:  1. Albuterol with Atrovent nebulizer every 2 to 3 times a day. 2. Amlodipine 10 mg daily. 3. Diovan 160 mg p.o. daily.  4. KCl 20 milliequivalents p.o. daily.  5. Levaquin 500 mg p.o. daily, started on 06/28. 6. Lovastatin 40 mg p.o. daily.  7. Reglan 10 mg p.o. 2 times day. 8. Montelukast 10 mg p.o. daily.  9. Omeprazole 20 mg delayed release capsule daily.  10. Zofran 4 mg every eight hours. 11. Prednisone was started on 06/28 tapering dose. 12. ProAir HFA two puffs every six hours.  13. Propranolol 20 mg p.o. b.i.d.  14. Spiriva 18 mcg inhalation daily. 15. Symbicort 1 puff b.i.d.  16. Theophylline 200 mg 2 capsules, that is 400 mg p.o. b.i.d.   REVIEW OF SYSTEMS: CONSTITUTIONAL: Has some fatigue. No fever and also had weakness. EYES: No blurred  vision. ENT: No tinnitus. No epistaxis. No difficulty swallowing. RESPIRATORY: Cough, trouble breathing for almost 1 week to 10 days. CARDIOVASCULAR: No chest pain, no orthopnea no PND no palpitations. No syncope. GENITOURINARY: Has no dysuria. GASTROINTESTINAL: No nausea. No vomiting. No abdominal pain. ENDOCRINE: No polyuria or nocturia.  INTEGUMENTARY: No skin rashes. MUSCULOSKELETAL:  no joint pains. NEUROLOGIC: The patient had an episode of seizure. History of transient ischemic attacks before. History of shakiness. PSYCH: No anxiety or insomnia.   PHYSICAL EXAMINATION:  VITAL SIGNS: Temperature 98.3, pulse 87, respirations 20, blood pressure 123/63, sats 98% on room air. She is slightly sedated at this time. Able to answer questions appropriately.   HEENT: Atraumatic, normocephalic. Pupils equally reacting to light. Extraocular movements are intact.   ENT: The patient has no facial swelling.   EYES: Normal to inspection, PERLA aeom i intact.   NOSE: No turbinate hypertrophy.   THROAT: No oropharyngeal erythema and the patient has no tonsillar exudates.   NECK: The patient has no thyroid enlargement. No lymphadenopathy. No stiff neck. No carotid bruit supple. Supple. No JVD.   RESPIRATORY: The patient has bilateral expiratory wheeze in all lung fields. Not using accessory muscles of respiration at this time.   CARDIOVASCULAR: S1 and S2 regular. No murmurs. PMI not displaced.   ABDOMEN: Soft, nontender, nondistended. Bowel sounds present.   EXTREMITIES: No extremity edema. No cyanosis. No clubbing.   SKIN: Warm and dry. No skin rashes.   NEUROLOGIC: The patient is slightly sedated but no obvious focal neurological deficits. Cranial nerves II through XII intact. Sensations are intact.   PSYCH: Mood and affect are within normal limits.   LABORATORY, RADIOLOGICAL AND DIAGNOSTIC DATA: WBC 45, hemoglobin 12, hematocrit 37.5, platelets 205. Chest x-ray showed hyperinflation and small nodule in the right middle lobe similar to prior. Port-A-Cath also present in SVC, right subclavian Port-A-Cath. No pneumonia on x-ray. CT of the head is done because of headache. It showed chronic vessel ischemic changes and encephalomalacia. BNP 347. Electrolytes: Sodium 140, potassium 3.8, chloride 103, bicarbonate 27, BUN 20, creatinine  1.17, glucose 86. LFTs within normal limits. Troponin is slightly elevated at 0.4. Theophylline level is less than 2. WBC on 06/25 was 1.7. The patient received Neulasta after that. EKG showed sinus rhythm with sinus arrhythmia at 79 beats per minute. No ST-T changes.   ASSESSMENT AND PLAN:  1. The patient is a 80 year old female with multiple medical problems came in because of the headache. CT of head is unremarkable. She had an episode of shaking and possible seizure in the Er  patient right now has no episodes of seizures and it was elected to admit to hospitalist service for seizures/copd flare., get an EEG. Use Ativan p.r.n. as needed for seizures. At this time, I do not think she needs any further work-up and we will monitor her closely on telemetry and probably her shaking episode might be accelerated by her chronic obstructive pulmonary disease with respiratory distress.  2. Chronic obstructive pulmonary disease exacerbation. The patient had leukocytosis secondary to steroid, Neulasta and she has very bad wheezing on lungs. This is an acute on chronic respiratory failure. The patient has history of chronic obstructive pulmonary disease before, so continue the oxygen, keep sats around 90% and continue nebulizers and Solu-Medrol and also theophylline levels are not therapeutic, so continue theophylline at home dose and continue Rocephin and Zithromax. Follow the cultures and follow her clinical course.  3. Hypertension. Blood pressure is stable. We will continue home  medications with Diovan, Norvasc and metoprolol and monitor on telemetry.  4. History of hyperlipidemia and transient ischemic attacks before. She has aspirin and also lovastatin and beta blockers. Continue that.  5. Slightly elevated troponins with no EKG changes. Likely demand ischemia, follow. Continue aspirin, beta blockers, and statins. 6. Deep vein thrombosis prophylaxis with Lovenox.  7. We have echocardiogram ordered.    Discussed the plan with the patient's niece and will talk to the family, the sons when they come in. The patient's condition is stable. We will consult Dr. Devona Konig for chronic obstructive pulmonary disease exacerbation and also Dr. Ma Hillock for her breast cancer     TOTAL TIME SPENT ON HISTORY AND PHYSICAL: 60 minutes.   ____________________________ Epifanio Lesches, MD sk:ap D: 02/01/2012 08:27:58 ET T: 02/01/2012 10:44:30 ET JOB#: [id number]  cc: Epifanio Lesches, MD, <Dictator> Allyne Gee, MD Epifanio Lesches MD ELECTRONICALLY SIGNED 02/10/2012 9:43

## 2014-11-26 NOTE — Op Note (Signed)
PATIENT NAME:  Nicole Jones, YONTZ MR#:  [id number] DATE OF BIRTH:  1934-08-10  DATE OF PROCEDURE:  11/13/2011  PREOPERATIVE DIAGNOSIS: Breast cancer, need for central venous access.   POSTOPERATIVE DIAGNOSIS: Breast cancer, need for central venous access.  OPERATIVE PROCEDURE: Right subclavian power port placement with ultrasound guidance.   OPERATING SURGEON: Robert Bellow, MD   ANESTHESIA: Attended local under Dr. Marcello Moores, 12 mL 1% plain Xylocaine.   ESTIMATED BLOOD LOSS: Minimal.   CLINICAL NOTE: This 80 year old woman has a stage II carcinoma of the left breast and central venous access was requested by her treating oncologist.   OPERATIVE NOTE: The patient received Kefzol prior to the procedure. The right chest was prepped with ChloraPrep and draped. Ultrasound confirmed patency of the subclavian vein. This was punctured on a single stick followed by passage of a guidewire and a dilator. The catheter was then advanced and positioned under fluoroscopy. This was put at the distal SVC right atrial junction. The catheter was tunneled to a pocket on the right anterior chest. It was anchored in place with 3-0 Prolene sutures. The wound was closed with a running 3-0 Vicryl suture to the adipose layer and running 4-0 Vicryl subcuticular suture for the skin. Benzoin, Steri-Strips, Telfa, and Tegaderm dressings were applied.   Erect portable chest x-ray in the recovery room showed good position and no evidence of pneumothorax.   ____________________________ Robert Bellow, MD jwb:drc D: 11/13/2011 13:36:31 ET T: 11/13/2011 13:51:34 ET JOB#: [id number]  cc: Robert Bellow, MD, <Dictator> Sandeep R. Ma Hillock, MD Keaja Reaume Amedeo Kinsman MD ELECTRONICALLY SIGNED 11/14/2011 10:20
